# Patient Record
Sex: Male | Born: 1985
Health system: Southern US, Community
[De-identification: ages and names within clinical notes are randomized; demographics above are authoritative.]

## PROBLEM LIST (undated history)

## (undated) DIAGNOSIS — I499 Cardiac arrhythmia, unspecified: Secondary | ICD-10-CM

## (undated) DIAGNOSIS — F988 Other specified behavioral and emotional disorders with onset usually occurring in childhood and adolescence: Secondary | ICD-10-CM

## (undated) DIAGNOSIS — K219 Gastro-esophageal reflux disease without esophagitis: Secondary | ICD-10-CM

## (undated) DIAGNOSIS — Z8489 Family history of other specified conditions: Secondary | ICD-10-CM

## (undated) HISTORY — PX: WISDOM TOOTH EXTRACTION: SHX21

## (undated) HISTORY — PX: NO PAST SURGERIES: SHX2092

## (undated) HISTORY — DX: Other specified behavioral and emotional disorders with onset usually occurring in childhood and adolescence: F98.8

---

## 2014-01-23 ENCOUNTER — Encounter (HOSPITAL_COMMUNITY): Payer: Self-pay | Admitting: Emergency Medicine

## 2014-01-23 ENCOUNTER — Emergency Department (HOSPITAL_COMMUNITY): Payer: BC Managed Care – PPO

## 2014-01-23 ENCOUNTER — Emergency Department (HOSPITAL_COMMUNITY)
Admission: EM | Admit: 2014-01-23 | Discharge: 2014-01-23 | Disposition: A | Discharge status: Home / Self Care | Payer: BC Managed Care – PPO | Attending: Emergency Medicine | Admitting: Emergency Medicine

## 2014-01-23 DIAGNOSIS — R296 Repeated falls: Secondary | ICD-10-CM | POA: Diagnosis not present

## 2014-01-23 DIAGNOSIS — R112 Nausea with vomiting, unspecified: Secondary | ICD-10-CM

## 2014-01-23 DIAGNOSIS — S0990XA Unspecified injury of head, initial encounter: Secondary | ICD-10-CM

## 2014-01-23 DIAGNOSIS — Y92009 Unspecified place in unspecified non-institutional (private) residence as the place of occurrence of the external cause: Secondary | ICD-10-CM | POA: Insufficient documentation

## 2014-01-23 DIAGNOSIS — Y9389 Activity, other specified: Secondary | ICD-10-CM | POA: Insufficient documentation

## 2014-01-23 DIAGNOSIS — K920 Hematemesis: Secondary | ICD-10-CM | POA: Diagnosis not present

## 2014-01-23 DIAGNOSIS — F1010 Alcohol abuse, uncomplicated: Secondary | ICD-10-CM | POA: Insufficient documentation

## 2014-01-23 DIAGNOSIS — F10920 Alcohol use, unspecified with intoxication, uncomplicated: Secondary | ICD-10-CM

## 2014-01-23 MED ORDER — ONDANSETRON HCL 4 MG/2ML IJ SOLN
4.0000 mg | Freq: Once | INTRAMUSCULAR | Status: AC
  Administered 2014-01-23: 4 mg via INTRAVENOUS
  Filled 2014-01-23: qty 2

## 2014-01-23 MED ORDER — SODIUM CHLORIDE 0.9 % IV BOLUS (SEPSIS)
2000.0000 mL | Freq: Once | INTRAVENOUS | Status: AC
  Administered 2014-01-23: 2000 mL via INTRAVENOUS

## 2014-01-23 MED ORDER — PROMETHAZINE HCL 25 MG PO TABS: 25.0000 mg | ORAL_TABLET | Freq: Four times a day (QID) | ORAL | Status: DC | PRN

## 2014-01-23 MED ORDER — ONDANSETRON 4 MG PO TBDP
4.0000 mg | ORAL_TABLET | Freq: Once | ORAL | Status: AC
  Administered 2014-01-23: 4 mg via ORAL
  Filled 2014-01-23: qty 1

## 2014-01-23 NOTE — ED Notes (Signed)
Pt brought in by friend for head injury following a fall today, friend reports alcohol consumption by pt throughout the day including shots of liquor and beer. Friend reports that after the fall, pt began vomiting up bright red blood. Pt denies any LOC during fall. Pt trying to get out of bed, slurred speech, pt intoxicated and refusing to answer questions at this time. Pt actively dry heaving.

## 2014-01-23 NOTE — Discharge Instructions (Signed)
Alcohol Intoxication °Alcohol intoxication occurs when the amount of alcohol that a person has consumed impairs his or her ability to mentally and physically function. Alcohol directly impairs the normal chemical activity of the brain. Drinking large amounts of alcohol can lead to changes in mental function and behavior, and it can cause many physical effects that can be harmful.  °Alcohol intoxication can range in severity from mild to very severe. Various factors can affect the level of intoxication that occurs, such as the person's age, gender, weight, frequency of alcohol consumption, and the presence of other medical conditions (such as diabetes, seizures, or heart conditions). Dangerous levels of alcohol intoxication may occur when people drink large amounts of alcohol in a short period (binge drinking). Alcohol can also be especially dangerous when combined with certain prescription medicines or "recreational" drugs. °SIGNS AND SYMPTOMS °Some common signs and symptoms of mild alcohol intoxication include: °· Loss of coordination. °· Changes in mood and behavior. °· Impaired judgment. °· Slurred speech. °As alcohol intoxication progresses to more severe levels, other signs and symptoms will appear. These may include: °· Vomiting. °· Confusion and impaired memory. °· Slowed breathing. °· Seizures. °· Loss of consciousness. °DIAGNOSIS  °Your health care provider will take a medical history and perform a physical exam. You will be asked about the amount and type of alcohol you have consumed. Blood tests will be done to measure the concentration of alcohol in your blood. In many places, your blood alcohol level must be lower than 80 mg/dL (0.08%) to legally drive. However, many dangerous effects of alcohol can occur at much lower levels.  °TREATMENT  °People with alcohol intoxication often do not require treatment. Most of the effects of alcohol intoxication are temporary, and they go away as the alcohol naturally  leaves the body. Your health care provider will monitor your condition until you are stable enough to go home. Fluids are sometimes given through an IV access tube to help prevent dehydration.  °HOME CARE INSTRUCTIONS °· Do not drive after drinking alcohol. °· Stay hydrated. Drink enough water and fluids to keep your urine clear or pale yellow. Avoid caffeine.   °· Only take over-the-counter or prescription medicines as directed by your health care provider.   °SEEK MEDICAL CARE IF:  °· You have persistent vomiting.   °· You do not feel better after a few days. °· You have frequent alcohol intoxication. Your health care provider can help determine if you should see a substance use treatment counselor. °SEEK IMMEDIATE MEDICAL CARE IF:  °· You become shaky or tremble when you try to stop drinking.   °· You shake uncontrollably (seizure).   °· You throw up (vomit) blood. This may be bright red or may look like black coffee grounds.   °· You have blood in your stool. This may be bright red or may appear as a black, tarry, bad smelling stool.   °· You become lightheaded or faint.   °MAKE SURE YOU:  °· Understand these instructions. °· Will watch your condition. °· Will get help right away if you are not doing well or get worse. °Document Released: 04/03/2005 Document Revised: 02/24/2013 Document Reviewed: 11/27/2012 °Exit[employer]® Patient Information ©2015 [employer]. This information is not intended to replace advice given to you by your health care provider. Make sure you discuss any questions you have with your health care provider. °Head Injury °You have received a head injury. It does not appear serious at this time. Headaches and vomiting are common following head injury. It should be   easy to awaken from sleeping. Sometimes it is necessary for you to stay in the emergency department for a while for observation. Sometimes admission to the hospital may be needed. After injuries such as yours, most problems occur  within the first 24 hours, but side effects may occur up to 7-10 days after the injury. It is important for you to carefully monitor your condition and contact your health care provider or seek immediate medical care if there is a change in your condition. °WHAT ARE THE TYPES OF HEAD INJURIES? °Head injuries can be as minor as a bump. Some head injuries can be more severe. More severe head injuries include: °· A jarring injury to the brain (concussion). °· A bruise of the brain (contusion). This mean there is bleeding in the brain that can cause swelling. °· A cracked skull (skull fracture). °· Bleeding in the brain that collects, clots, and forms a bump (hematoma). °WHAT CAUSES A HEAD INJURY? °A serious head injury is most likely to happen to someone who is in a car wreck and is not wearing a seat belt. Other causes of major head injuries include bicycle or motorcycle accidents, sports injuries, and falls. °HOW ARE HEAD INJURIES DIAGNOSED? °A complete history of the event leading to the injury and your current symptoms will be helpful in diagnosing head injuries. Many times, pictures of the brain, such as CT or MRI are needed to see the extent of the injury. Often, an overnight hospital stay is necessary for observation.  °WHEN SHOULD I SEEK IMMEDIATE MEDICAL CARE?  °You should get help right away if: °· You have confusion or drowsiness. °· You feel sick to your stomach (nauseous) or have continued, forceful vomiting. °· You have dizziness or unsteadiness that is getting worse. °· You have severe, continued headaches not relieved by medicine. Only take over-the-counter or prescription medicines for pain, fever, or discomfort as directed by your health care provider. °· You do not have normal function of the arms or legs or are unable to walk. °· You notice changes in the black spots in the center of the colored part of your eye (pupil). °· You have a clear or bloody fluid coming from your nose or ears. °· You have  a loss of vision. °During the next 24 hours after the injury, you must stay with someone who can watch you for the warning signs. This person should contact local emergency services (911 in the [place] if you have seizures, you become unconscious, or you are unable to wake up. °HOW CAN I PREVENT A HEAD INJURY IN THE FUTURE? °The most important factor for preventing major head injuries is avoiding motor vehicle accidents.  To minimize the potential for damage to your head, it is crucial to wear seat belts while riding in motor vehicles. Wearing helmets while bike riding and playing collision sports (like football) is also helpful. Also, avoiding dangerous activities around the house will further help reduce your risk of head injury.  °WHEN CAN I RETURN TO NORMAL ACTIVITIES AND ATHLETICS? °You should be reevaluated by your health care provider before returning to these activities. If you have any of the following symptoms, you should not return to activities or contact sports until 1 week after the symptoms have stopped: °· Persistent headache. °· Dizziness or vertigo. °· Poor attention and concentration. °· Confusion. °· Memory problems. °· Nausea or vomiting. °· Fatigue or tire easily. °· Irritability. °· Intolerant of bright lights or loud noises. °· Anxiety or depression. °·   Disturbed sleep. °MAKE SURE YOU:  °· Understand these instructions. °· Will watch your condition. °· Will get help right away if you are not doing well or get worse. °Document Released: 06/24/2005 Document Revised: 06/29/2013 Document Reviewed: 03/01/2013 °Exit[employer]® Patient Information ©2015 [employer]. This information is not intended to replace advice given to you by your health care provider. Make sure you discuss any questions you have with your health care provider. ° °

## 2014-01-23 NOTE — ED Provider Notes (Signed)
TIME SEEN: 5:15 PM  CHIEF COMPLAINT: Fall, head injury, intoxication  HPI: Patient is a Edward Bentley from with no significant past medical history who presents emergency department intoxicated with vomiting. He lives with his fiance he states he has been drinking all day with his friends at a house warming party. She states that today he fell from a standing position, striking his head. No loss of consciousness. He has been vomiting since and has had some blood-streaked vomit. She denies that she thinks he's had any drug use. No other injury on exam. History is limited given patient is to intoxicated.  ROS: See HPI Constitutional: no fever  Eyes: no drainage  ENT: no runny nose   Cardiovascular:  no chest pain  Resp: no SOB  GI: no vomiting GU: no dysuria Integumentary: no rash  Allergy: no hives  Musculoskeletal: no leg swelling  Neurological: no slurred speech ROS otherwise negative  PAST MEDICAL HISTORY/PAST SURGICAL HISTORY:  History reviewed. No pertinent past medical history.  MEDICATIONS:  Prior to Admission medications   Not on File    ALLERGIES:  No Known Allergies  SOCIAL HISTORY:  History  Substance Use Topics  . Smoking status: Never Smoker   . Smokeless tobacco: Not on file  . Alcohol Use: Yes     Comment: occassional    FAMILY HISTORY: No family history on file.  EXAM: BP 107/57  Pulse 115  Temp(Src) 97.9 F (36.6 C) (Oral)  Resp 16  SpO2 94% CONSTITUTIONAL: Alert and oriented and responds appropriately to questions intermittently. Appears intoxicated, actively vomiting, smells of alcohol HEAD: Normocephalic, abrasion and small hematoma to his right forehead EYES: Conjunctivae clear, PERRL ENT: normal nose; no rhinorrhea; moist mucous membranes; pharynx without lesions noted NECK: Supple, no meningismus, no LAD, no midline spinal tenderness or step-off or deformity  CARD: RRR; S1 and S2 appreciated; no murmurs, no clicks, no rubs, no gallops RESP: Normal  chest excursion without splinting or tachypnea; breath sounds clear and equal bilaterally; no wheezes, no rhonchi, no rales,  ABD/GI: Normal bowel sounds; non-distended; soft, non-tender, no rebound, no guarding BACK:  The back appears normal and is non-tender to palpation, there is no CVA tenderness, no midline spinal tenderness or step-off or deformity EXT: Normal ROM in all joints; non-tender to palpation; no edema; normal capillary refill; no cyanosis    SKIN: Normal color for age and race; warm NEURO: Moves all extremities equally, patient has mild slurred speech but is intoxicated, otherwise cranial nerves II through XII intact, normal sensation diffusely PSYCH: The patient's mood and manner are appropriate. Grooming and personal hygiene are appropriate.  MEDICAL DECISION MAKING: Patient here with alcohol intoxication, vomiting and a head injury. Vomiting may be secondary to intoxication but given history of head injury and active vomiting, will obtain a CT of his head and cervical spine. We'll give IV fluids and Zofran and reassess.  ED PROGRESS: CT head and cervical spine show no acute injury. His vomiting has stopped. We'll reassess him clinically sober.   9:12 PM  Pt states he is feeling much better. He is tearful, states he is embarrassed. He is now able to tolerate by mouth, walking around the room. His fiance is at the bedside and can drive him home. He is neurologically intact and I feel he is safe for discharge.  Edward MawKristen N Sheala Dosh, DO 01/23/14 2112

## 2014-01-23 NOTE — ED Notes (Signed)
Pt alert, able to stand at bedside to void.

## 2014-09-02 ENCOUNTER — Encounter: Payer: Self-pay | Admitting: Internal Medicine

## 2014-09-02 ENCOUNTER — Ambulatory Visit (INDEPENDENT_AMBULATORY_CARE_PROVIDER_SITE_OTHER): Payer: 59 | Admitting: Internal Medicine

## 2014-09-02 ENCOUNTER — Encounter: Payer: Self-pay | Admitting: Radiology

## 2014-09-02 VITALS — BP 112/84 | HR 68 | Temp 98.4°F | Ht 69.66 in | Wt 219.0 lb

## 2014-09-02 DIAGNOSIS — K4090 Unilateral inguinal hernia, without obstruction or gangrene, not specified as recurrent: Secondary | ICD-10-CM

## 2014-09-02 DIAGNOSIS — F909 Attention-deficit hyperactivity disorder, unspecified type: Secondary | ICD-10-CM

## 2014-09-02 DIAGNOSIS — L819 Disorder of pigmentation, unspecified: Secondary | ICD-10-CM

## 2014-09-02 DIAGNOSIS — Z0000 Encounter for general adult medical examination without abnormal findings: Secondary | ICD-10-CM

## 2014-09-02 DIAGNOSIS — F988 Other specified behavioral and emotional disorders with onset usually occurring in childhood and adolescence: Secondary | ICD-10-CM

## 2014-09-02 LAB — CBC WITH DIFFERENTIAL/PLATELET
Basophils Absolute: 0 10*3/uL (ref 0.0–0.1)
Basophils Relative: 0 % (ref 0–1)
Eosinophils Absolute: 0.1 10*3/uL (ref 0.0–0.7)
Eosinophils Relative: 1 % (ref 0–5)
HEMATOCRIT: 42.9 % (ref 39.0–52.0)
HEMOGLOBIN: 14.8 g/dL (ref 13.0–17.0)
LYMPHS ABS: 2.6 10*3/uL (ref 0.7–4.0)
Lymphocytes Relative: 32 % (ref 12–46)
MCH: 29.5 pg (ref 26.0–34.0)
MCHC: 34.5 g/dL (ref 30.0–36.0)
MCV: 85.6 fL (ref 78.0–100.0)
MONOS PCT: 8 % (ref 3–12)
MPV: 9.9 fL (ref 8.6–12.4)
Monocytes Absolute: 0.6 10*3/uL (ref 0.1–1.0)
NEUTROS ABS: 4.8 10*3/uL (ref 1.7–7.7)
NEUTROS PCT: 59 % (ref 43–77)
Platelets: 262 10*3/uL (ref 150–400)
RBC: 5.01 MIL/uL (ref 4.22–5.81)
RDW: 12.9 % (ref 11.5–15.5)
WBC: 8.1 10*3/uL (ref 4.0–10.5)

## 2014-09-02 MED ORDER — AMPHETAMINE-DEXTROAMPHET ER 10 MG PO CP24: 10.0000 mg | ORAL_CAPSULE | Freq: Every day | ORAL | Status: DC

## 2014-09-02 NOTE — Progress Notes (Signed)
HPI  Pt presents to the clinic today to establish care. He is transferring care from [facility].  Flu: never Tetanus:2008 Dentist: as needed  ADD: diagnosed at age 29, psych [facility]. Has been on Adderall, Concerta, Straterra. He had trouble affording his medication. He tolerated Adderall the best. He has trouble focusing, staying organized, feels distracted, doesn't stay on task, etc. 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  He also reports pain in his right groin. This has been there for the past few months. It hurts when he coughs or sneezes. He has not had any injury to the area. He has not tried anything OTC.  History reviewed. No pertinent past medical history.  No current outpatient prescriptions on file.   No current facility-administered medications for this visit.    No Known Allergies  Family History  Problem Relation Age of Onset  . Hypertension Mother   . Hypertension Father   . Heart disease Maternal Grandfather   . Alcohol abuse Paternal Grandfather     History   Social History  . Marital Status: Single    Spouse Name: N/A  . Number of Children: N/A  . Years of Education: N/A   Occupational History  . Not on file.   Social History Main Topics  . Smoking status: Never Smoker   . Smokeless tobacco: Never Used  . Alcohol Use: 0.0 oz/week    0 Standard drinks or equivalent per week     Comment: occasional--social  . Drug Use: Not on file  . Sexual Activity: Not on file   Other Topics Concern  . Not on file   Social History Narrative    ROS:  Constitutional: Denies fever, malaise, fatigue, headache or abrupt weight changes.  HEENT: Denies eye pain, eye redness, ear pain, ringing in the ears, wax buildup, runny nose, nasal congestion, bloody nose, or sore throat. Respiratory: Denies difficulty breathing, shortness of breath, cough or sputum production.   Cardiovascular: Denies chest pain, chest tightness, palpitations or  swelling in the hands or feet.  Gastrointestinal: Denies abdominal pain, bloating, constipation, diarrhea or blood in the stool.  GU: Denies frequency, urgency, pain with urination, blood in urine, odor or discharge. Musculoskeletal: Pt reports right groin pain. Denies decrease in range of motion, difficulty with gait, muscle pain or joint pain and swelling.  Skin: Pt reports abnormal mole on back. Denies redness, rashes, lesions or ulcercations.  Neurological: Pt reports difficulty with memory, concentration, attention and focusing. Denies dizziness, difficulty with speech or problems with balance and coordination.   No other specific complaints in a complete review of systems (except as listed in HPI above).  PE:  BP 112/84 mmHg  Pulse 68  Temp(Src) 98.4 F (36.9 C) (Oral)  Ht 5' 9.66" (1.769 m)  Wt 219 lb (99.338 kg)  BMI 31.74 kg/m2  SpO2 98% Wt Readings from Last 3 Encounters:  09/02/14 219 lb (99.338 kg)    General: Appears his stated age, well developed, well nourished in NAD. Skin: Warm, dry and intact. Small, circular < 1 cm mole noted on inferior back. Flesh colored with darker center. HEENT: Head: normal shape and size; Eyes: sclera white, no icterus, conjunctiva pink, PERRLA and EOMs intact; Ears: Tm's gray and intact, normal light reflex; Throat/Mouth: Teeth present, mucosa pink and moist, no lesions or ulcerations noted.  Neck: Neck supple, trachea midline. No massses, lumps or thyromegaly present.  Cardiovascular: Normal rate and rhythm. S1,S2 noted.  No murmur, rubs or  gallops noted. No JVD or BLE edema. No carotid bruits noted. Pulmonary/Chest: Normal effort and positive vesicular breath sounds. No respiratory distress. No wheezes, rales or ronchi noted.  Abdomen: Soft and nontender. Normal bowel sounds, no bruits noted. No distention or masses noted. Liver, spleen and kidneys non palpable. Right, direct inguinal hernia noted. Musculoskeletal: Normal range of motion.  Strength 5/5 BUE/BLE. No difficulty with gait.  Neurological: Alert and oriented. Cranial nerves II-XII grossly intact.  Psychiatric: Mood and affect normal. Behavior is normal. Judgment normal. Thought jumping from topic to topic.    Assessment and Plan:  Preventative Health Maintenance:  Encouraged him to work on diet and exercise Will check CBC, CMET and Lipid profile today Encouraged him to vist a dentist at least yearly  Right inguinal hernia:  Referral placed to general surgery  Atypical Mole on back:  He will self-refer to dermatology  RTC in 1 year or sooner if needed

## 2014-09-02 NOTE — Assessment & Plan Note (Signed)
Will start Adderall 10 mg ER CSA and UDS done today

## 2014-09-02 NOTE — Patient Instructions (Signed)
Granuloma Inguinale Granuloma inguinale is a sexually transmitted infection (STI) of the genital area. Most often, the infection is spread through vaginal or anal intercourse. This infection causes painless bumps and ulcers in the genital and anal areas. If not treated early, the infection can cause damage to the genital tissue. Secondary infections may also develop. People with granuloma inguinale are also at risk for other sexually transmitted diseases (STDs). Granuloma inguinale is more common in men than in women.  CAUSES  Granuloma inguinale is caused by the bacteria called Klebsiella granulomatis. It is spread through sexual contact with the infected person.  SYMPTOMS  Symptoms appear 1 to 12 weeks after the person comes in contact with the bacteria. Symptoms involve the genital and anal area and include:  Red bumps that are often painless.  Breaks in the skin (ulcerations).  The destruction of genital tissue that may spread to the legs.  Loss of skin color of the genitals and surrounding area. DIAGNOSIS  Your caregiver will examine the sores in the genital or anal area. You will be asked how long the sores have been there. If the sores have been there for a long time or if they are spreading, this is an indication that you may have this infection or another STD. Your caregiver may take scrapings or a tissue sample (biopsy) from the affected area. If any STD is detected, your caregiver may choose to test for other STDs. This may include chlamydia, gonorrhea, syphilis, human papillomavirus (HPV), and HIV or AIDS. Ask when your test results will be ready. Make sure you get your test results. TREATMENT  Treatment of this disease lowers the risk of ongoing damage and worsening scarring to the genitals and skin tissue. Antibiotic medicines are prescribed to treat this infection.You may need totake antibiotics for 3 weeks or longer.Treatment should continue for the length of time prescribed.  If  an STD is discovered, treatment may be started to cover other possible infections.Follow-up examination is necessary because recurrence of this infection is common. HOME CARE INSTRUCTIONS   Take your antibiotic medicine as directed. Finish it even if you start to feel better.  Only take over-the-counter or prescription medicines as directed by your caregiver.  Schedule appointments for follow-up exams.  Do not engage in sexual activity until treatment is completed. Your sexual partner(s) should also be examined and treated.  Practice safe sex and use condoms to help prevent the spread of STDs. SEEK IMMEDIATE MEDICAL CARE IF:  You have a fever or persistent symptoms for more than 2-3 days.  You have a fever and your symptoms suddenly get worse.  Your infection is not getting better within 7 days of treatment.  Your infection recurs. MAKE SURE YOU:   Understand these instructions.  Will watch your condition.  Will get help right away if you are not doing well or get worse. Document Released: 06/21/2000 Document Revised: 03/18/2012 Document Reviewed: 01/21/2012 [facility] Patient Information 2015 [place], [place]. This information is not intended to replace advice given to you by your health care provider. Make sure you discuss any questions you have with your health care provider. Inguinal Hernia, Adult Muscles help keep everything in the body in its proper place. But if a weak spot in the muscles develops, something can poke through. That is called a hernia. When this happens in the lower part of the belly (abdomen), it is called an inguinal hernia. (It takes its name from a part of the body in this region called the  inguinal canal.) A weak spot in the wall of muscles lets some fat or part of the small intestine bulge through. An inguinal hernia can develop at any age. Men get them more often than women. CAUSES  In adults, an inguinal hernia develops over time.  It can be triggered  by:  Suddenly straining the muscles of the lower abdomen.  Lifting heavy objects.  Straining to have a bowel movement. Difficult bowel movements (constipation) can lead to this.  Constant coughing. This may be caused by smoking or lung disease.  Being overweight.  Being pregnant.  Working at a job that requires long periods of standing or heavy lifting.  Having had an inguinal hernia before. One type can be an emergency situation. It is called a strangulated inguinal hernia. It develops if part of the small intestine slips through the weak spot and cannot get back into the abdomen. The blood supply can be cut off. If that happens, part of the intestine may die. This situation requires emergency surgery. SYMPTOMS  Often, a small inguinal hernia has no symptoms. It is found when a healthcare provider does a physical exam. Larger hernias usually have symptoms.   In adults, symptoms may include:  A lump in the groin. This is easier to see when the person is standing. It might disappear when lying down.  In men, a lump in the scrotum.  Pain or burning in the groin. This occurs especially when lifting, straining or coughing.  A dull ache or feeling of pressure in the groin.  Signs of a strangulated hernia can include:  A bulge in the groin that becomes very painful and tender to the touch.  A bulge that turns red or purple.  Fever, nausea and vomiting.  Inability to have a bowel movement or to pass gas. DIAGNOSIS  To decide if you have an inguinal hernia, a healthcare provider will probably do a physical examination.  This will include asking questions about any symptoms you have noticed.  The healthcare provider might feel the groin area and ask you to cough. If an inguinal hernia is felt, the healthcare provider may try to slide it back into the abdomen.  Usually no other tests are needed. TREATMENT  Treatments can vary. The size of the hernia makes a difference. Options  include:  Watchful waiting. This is often suggested if the hernia is small and you have had no symptoms.  No medical procedure will be done unless symptoms develop.  You will need to watch closely for symptoms. If any occur, contact your healthcare provider right away.  Surgery. This is used if the hernia is larger or you have symptoms.  Open surgery. This is usually an outpatient procedure (you will not stay overnight in a hospital). An cut (incision) is made through the skin in the groin. The hernia is put back inside the abdomen. The weak area in the muscles is then repaired by herniorrhaphy or hernioplasty. Herniorrhaphy: in this type of surgery, the weak muscles are sewn back together. Hernioplasty: a patch or mesh is used to close the weak area in the abdominal wall.  Laparoscopy. In this procedure, a surgeon makes small incisions. A thin tube with a tiny video camera (called a laparoscope) is put into the abdomen. The surgeon repairs the hernia with mesh by looking with the video camera and using two long instruments. HOME CARE INSTRUCTIONS   After surgery to repair an inguinal hernia:  You will need to take pain medicine prescribed  by your healthcare provider. Follow all directions carefully.  You will need to take care of the wound from the incision.  Your activity will be restricted for awhile. This will probably include no heavy lifting for several weeks. You also should not do anything too active for a few weeks. When you can return to work will depend on the type of job that you have.  During "watchful waiting" periods, you should:  Maintain a healthy weight.  Eat a diet high in fiber (fruits, vegetables and whole grains).  Drink plenty of fluids to avoid constipation. This means drinking enough water and other liquids to keep your urine clear or pale yellow.  Do not lift heavy objects.  Do not stand for long periods of time.  Quit smoking. This should keep you from  developing a frequent cough. SEEK MEDICAL CARE IF:   A bulge develops in your groin area.  You feel pain, a burning sensation or pressure in the groin. This might be worse if you are lifting or straining.  You develop a fever of more than 100.5 F (38.1 C). SEEK IMMEDIATE MEDICAL CARE IF:   Pain in the groin increases suddenly.  A bulge in the groin gets bigger suddenly and does not go down.  For men, there is sudden pain in the scrotum. Or, the size of the scrotum increases.  A bulge in the groin area becomes red or purple and is painful to touch.  You have nausea or vomiting that does not go away.  You feel your heart beating much faster than normal.  You cannot have a bowel movement or pass gas.  You develop a fever of more than 102.0 F (38.9 C). Document Released: 11/10/2008 Document Revised: 09/16/2011 Document Reviewed: 11/10/2008 [facility] Patient Information 2015 [place], [place]. This information is not intended to replace advice given to you by your health care provider. Make sure you discuss any questions you have with your health care provider.

## 2014-09-02 NOTE — Progress Notes (Signed)
Pre visit review using our clinic review tool, if applicable. No additional management support is needed unless otherwise documented below in the visit note. 

## 2014-09-03 LAB — LIPID PANEL
CHOL/HDL RATIO: 6.8 ratio
Cholesterol: 184 mg/dL (ref 0–200)
HDL: 27 mg/dL — AB (ref >=40)
LDL Cholesterol: 82 mg/dL (ref 0–99)
TRIGLYCERIDES: 377 mg/dL — AB (ref <150)
VLDL: 75 mg/dL — AB (ref 0–40)

## 2014-09-03 LAB — COMPREHENSIVE METABOLIC PANEL
ALT: 33 U/L (ref 0–53)
AST: 21 U/L (ref 0–37)
Albumin: 4.6 g/dL (ref 3.5–5.2)
Alkaline Phosphatase: 67 U/L (ref 39–117)
BILIRUBIN TOTAL: 1.2 mg/dL (ref 0.2–1.2)
BUN: 14 mg/dL (ref 6–23)
CO2: 27 mEq/L (ref 19–32)
Calcium: 9.6 mg/dL (ref 8.4–10.5)
Chloride: 103 mEq/L (ref 96–112)
Creat: 0.96 mg/dL (ref 0.50–1.35)
Glucose, Bld: 86 mg/dL (ref 70–99)
Potassium: 4.2 mEq/L (ref 3.5–5.3)
SODIUM: 138 meq/L (ref 135–145)
TOTAL PROTEIN: 7.5 g/dL (ref 6.0–8.3)

## 2014-09-19 ENCOUNTER — Ambulatory Visit (INDEPENDENT_AMBULATORY_CARE_PROVIDER_SITE_OTHER): Payer: Self-pay | Admitting: Surgery

## 2014-09-19 NOTE — H&P (Signed)
Edward Bentley. Crumpacker 09/19/2014 11:06 AM Location: [facility] Patient #: [id number] DOB: July 20, 1985 Married / Language: [place] / Race: White Male History of Present Illness Edward Bentley; 09/19/2014 11:34 AM) Patient words: RIH  pt sent at the request of [name] NP for right inguinal hernia present for 4 - 5 years.  The patient is a 29 year old male who presents with an inguinal hernia. The hernia(s) is/are located on the right side. Symptoms include inguinal bulge and inguinal pain. The pain is located in the right inguinal area. There is no radiation. The patient describes the pain as dull. There is no known event that preceded symptom onset. The patient describes this as mild and worsening. Symptoms are exacerbated by lifting. Other Problems [name], CMA; 09/19/2014 11:06 AM) No pertinent past medical history  Past Surgical History [name], CMA; 09/19/2014 11:06 AM) Oral Surgery  Diagnostic Studies History [name], CMA; 09/19/2014 11:06 AM) Colonoscopy never  Allergies ([name], CMA; 09/19/2014 11:07 AM) No Known Drug Allergies 09/19/2014  Medication History ([name], CMA; 09/19/2014 11:08 AM) Amphetamine-Dextroamphet ER (  Capsule ER 24HR, Oral) Active. Medications Reconciled  Social History [name], CMA; 09/19/2014 11:06 AM) Alcohol use Moderate alcohol use. Caffeine use Carbonated beverages, Coffee, Tea. No drug use Tobacco use Never smoker.  Family History [name], CMA; 09/19/2014 11:06 AM) Heart Disease Family Members In General. Heart disease in male family member before age 41 Hypertension Family Members In General, Father. Migraine Headache Mother.     Review of Systems [name] CMA; 09/19/2014 11:06 AM) General Not Present- Appetite Loss, Chills, Fatigue, Fever, Night Sweats, Weight Gain and Weight Loss. Skin Not Present- Change in Wart/Mole, Dryness, Hives, Jaundice, New Lesions, Non-Healing  Wounds, Rash and Ulcer. HEENT Present- Wears glasses/contact lenses. Not Present- Earache, Hearing Loss, Hoarseness, Nose Bleed, Oral Ulcers, Ringing in the Ears, Seasonal Allergies, Sinus Pain, Sore Throat, Visual Disturbances and Yellow Eyes. Respiratory Present- Snoring. Not Present- Bloody sputum, Chronic Cough, Difficulty Breathing and Wheezing. Breast Not Present- Breast Mass, Breast Pain, Nipple Discharge and Skin Changes. Cardiovascular Not Present- Chest Pain, Difficulty Breathing Lying Down, Leg Cramps, Palpitations, Rapid Heart Rate, Shortness of Breath and Swelling of Extremities. Gastrointestinal Not Present- Abdominal Pain, Bloating, Bloody Stool, Change in Bowel Habits, Chronic diarrhea, Constipation, Difficulty Swallowing, Excessive gas, Gets full quickly at meals, Hemorrhoids, Indigestion, Nausea, Rectal Pain and Vomiting. Male Genitourinary Not Present- Blood in Urine, Change in Urinary Stream, Frequency, Impotence, Nocturia, Painful Urination, Urgency and Urine Leakage. Musculoskeletal Not Present- Back Pain, Joint Pain, Joint Stiffness, Muscle Pain, Muscle Weakness and Swelling of Extremities. Neurological Not Present- Decreased Memory, Fainting, Headaches, Numbness, Seizures, Tingling, Tremor, Trouble walking and Weakness. Psychiatric Not Present- Anxiety, Bipolar, Change in Sleep Pattern, Depression, Fearful and Frequent crying. Endocrine Not Present- Cold Intolerance, Excessive Hunger, Hair Changes, Heat Intolerance, Hot flashes and New Diabetes. Hematology Not Present- Easy Bruising, Excessive bleeding, Gland problems, HIV and Persistent Infections.  Vitals ([name] CMA; 09/19/2014 11:07 AM) 09/19/2014 11:07 AM Weight: 216 lb Height: 70in Body Surface Area: 2.2 m Body Mass Index: 30.99 kg/m Temp.: 97.67F(Temporal)  Pulse: 77 (Regular)  BP: 130/70 (Sitting, Left Arm, Standard)     Physical Exam ([name] [name] Bentley; 09/19/2014 11:35  AM)  General Mental Status-Alert. General Appearance-Consistent with stated age. Hydration-Well hydrated. Voice-Normal.  Head and Neck Head-normocephalic, atraumatic with no lesions or palpable masses. Trachea-midline.  Chest and Lung Exam Chest and lung exam reveals -quiet, even and easy respiratory effort  with no use of accessory muscles and on auscultation, normal breath sounds, no adventitious sounds and normal vocal resonance. Inspection Chest Wall - Normal. Back - normal.  Cardiovascular Cardiovascular examination reveals -normal heart sounds, regular rate and rhythm with no murmurs and normal pedal pulses bilaterally.  Abdomen Inspection Hernias - Inguinal hernia - Right - Reducible.  Neurologic Neurologic evaluation reveals -alert and oriented x 3 with no impairment of recent or remote memory. Mental Status-Normal.  Musculoskeletal Normal Exam - Left-Upper Extremity Strength Normal and Lower Extremity Strength Normal. Normal Exam - Right-Upper Extremity Strength Normal, Lower Extremity Weakness.    Assessment & Plan ([name] [name] Bentley; 09/19/2014 11:32 AM)  RIGHT INGUINAL HERNIA (550.90  K40.90) Impression: discussed open and laparoscopic repair with mesh. pt would like to proceed with open right inguinal hernia rpair. The risk of hernia repair include bleeding, infection, organ injury, bowel injury, bladder injury, nerve injury recurrent hernia, blood clots, worsening of underlying condition, chronic pain, mesh use, open surgery, death, and the need for other operattions. Pt agrees to proceed  Current Plans Pt Education - [facility] Umbilical/ Inguinal Hernia HCI

## 2014-09-21 ENCOUNTER — Encounter: Payer: Self-pay | Admitting: Internal Medicine

## 2014-10-03 ENCOUNTER — Telehealth: Payer: Self-pay

## 2014-10-03 NOTE — Telephone Encounter (Signed)
Pt left v/m pt was seen 09/02/14 and was to cb if needing to increase Adderall. Pt request cb. Left v/m requesting pt to cb.

## 2014-10-05 NOTE — Telephone Encounter (Signed)
Pt request increase in Adderall XR 10 mg dosage;when pt takes Adderall XR 10 mg only suppresses appetite; no increase in focusing. When pt takes Adderall XR 10 mg taking 2 tabs pt can tell he has taken medication;pt is more focused. Pt request new rx for Adderall XR 20 mg taking one daily. Pt seen 09/02/14. Pt is out of med and request cb ASAP.

## 2014-10-05 NOTE — Telephone Encounter (Signed)
Okto increase to 20 mg once daily. OK to refill if Dr. Dayton MartesAron will sign for in my absence

## 2014-10-05 NOTE — Telephone Encounter (Signed)
Pt left v/m returning call. Left v/m requesting cb from pt.

## 2014-10-05 NOTE — Telephone Encounter (Signed)
Since this is a change of dosage in a controlled substances, I will need to defer this to PCP.

## 2014-10-06 ENCOUNTER — Telehealth: Payer: Self-pay | Admitting: Internal Medicine

## 2014-10-06 MED ORDER — AMPHETAMINE-DEXTROAMPHET ER 20 MG PO CP24: 20.0000 mg | ORAL_CAPSULE | ORAL | Status: DC

## 2014-10-06 NOTE — Telephone Encounter (Signed)
LVM for pt to call office. Pt does NOT need to have lab ([facility]) [facility] He did that [facility] Feb. A mistake was made on the envelope. He does need to come pick up the RX. [facility]

## 2014-10-06 NOTE — Telephone Encounter (Signed)
Ok to print and put in my box for signature. 

## 2014-10-06 NOTE — Telephone Encounter (Signed)
Lm on pts vm and informed him Rx is available for pickup

## 2014-11-03 ENCOUNTER — Other Ambulatory Visit: Payer: Self-pay | Admitting: *Deleted

## 2014-11-03 ENCOUNTER — Other Ambulatory Visit: Payer: Self-pay | Admitting: Internal Medicine

## 2014-11-03 MED ORDER — AMPHETAMINE-DEXTROAMPHET ER 30 MG PO CP24: 30.0000 mg | ORAL_CAPSULE | ORAL | Status: DC

## 2014-11-03 NOTE — Telephone Encounter (Signed)
RX for 30 mg printed and signed, placed in MYD box

## 2014-11-03 NOTE — Telephone Encounter (Signed)
OV 09/02/14.  Last filled #30 10/06/14.  Medication was recently increased from 10 to 20mg .  Patient is requesting another increase as he feels the 20mg  is still not helpful.  He says he used to take 30mg  in the past and this worked well.

## 2014-11-04 NOTE — Telephone Encounter (Signed)
Rx left in front office for pick up and pt is aware  

## 2014-12-02 ENCOUNTER — Other Ambulatory Visit: Payer: Self-pay

## 2014-12-02 MED ORDER — AMPHETAMINE-DEXTROAMPHET ER 30 MG PO CP24: 30.0000 mg | ORAL_CAPSULE | ORAL | Status: DC

## 2014-12-02 NOTE — Telephone Encounter (Signed)
Pt is aware that Rx is ready and appt made for Sat clinic

## 2014-12-02 NOTE — Telephone Encounter (Signed)
RX for Adderall printed and signed and placed in MYD box. He will need to be seen for rash as he has not mentioned this in the past.

## 2014-12-02 NOTE — Telephone Encounter (Signed)
Pt left v/m requesting rx for Adderall. Call when ready for pick up. rx last printed 11/03/14 and pt seen 09/02/14. Pt also request rx for ketoconazole for summertime rash; do not see on current or hx med list. Does pt need to be seen about rash?

## 2014-12-03 ENCOUNTER — Ambulatory Visit (INDEPENDENT_AMBULATORY_CARE_PROVIDER_SITE_OTHER): Payer: 59 | Admitting: Family Medicine

## 2014-12-03 ENCOUNTER — Encounter: Payer: Self-pay | Admitting: Family Medicine

## 2014-12-03 VITALS — BP 100/70 | Temp 97.8°F | Wt 213.0 lb

## 2014-12-03 DIAGNOSIS — B360 Pityriasis versicolor: Secondary | ICD-10-CM | POA: Diagnosis not present

## 2014-12-03 MED ORDER — FLUCONAZOLE 150 MG PO TABS: 300.0000 mg | ORAL_TABLET | Freq: Every day | ORAL | Status: DC

## 2014-12-03 NOTE — Assessment & Plan Note (Signed)
Failed topical tx prev.  Use diflucan PO, routine cautions d/w pt.  He agrees.  F/u prn.

## 2014-12-03 NOTE — Patient Instructions (Signed)
Tinea versicolor.  Use the diflucan for 2 weeks.   Take care.  Glad to see you.

## 2014-12-03 NOTE — Progress Notes (Signed)
Rash noted.  Intermittently since 2007, has it every summer.  Had seen derm prev.  Was told it was a fungus prev.  Photosensitive rash.   Meds, vitals, and allergies reviewed.   ROS: See HPI.  Otherwise, noncontributory.  nad Blanching macular rash on the trunk, looks typical for tinea versicolor.

## 2014-12-28 IMAGING — CT CT HEAD W/O CM
3 of 8 series · 9 of 47 positions shown, 11 images · non-contrast
Comparison: None.

CLINICAL DATA: Intoxicated patient who fell and sustained a head
injury.

EXAM:
CT HEAD WITHOUT CONTRAST
CT CERVICAL SPINE WITHOUT CONTRAST
TECHNIQUE: Multidetector CT imaging of the head and cervical spine was
performed following the standard protocol without intravenous
contrast. Multiplanar CT image reconstructions of the cervical spine
were also generated.

[Series 10: sagittals · sagittal · 0.31mm/px · 1 of 36 slices shown]
[im 18/36  brain]
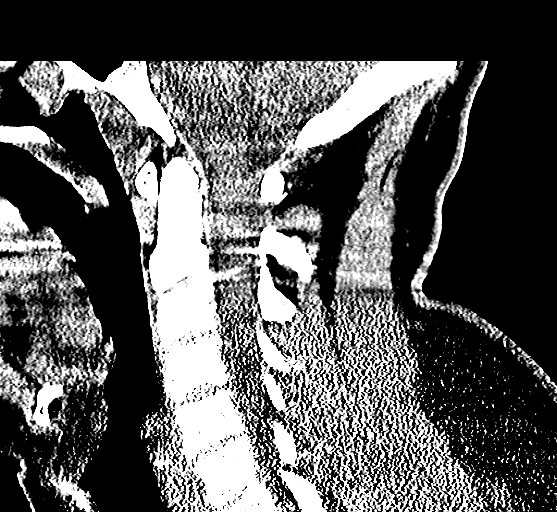

[Series 11: orthogonals · axial · 0.16mm/px · z∈[+218,+334]mm · 5 of 75 slices shown, 7 images]
[im 8/75  brain]
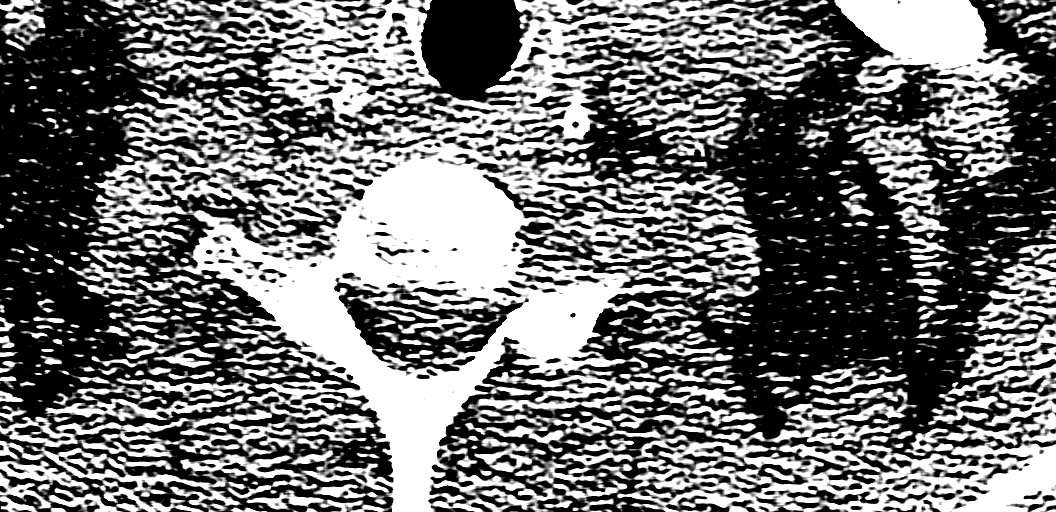
[im 8/75  bone]
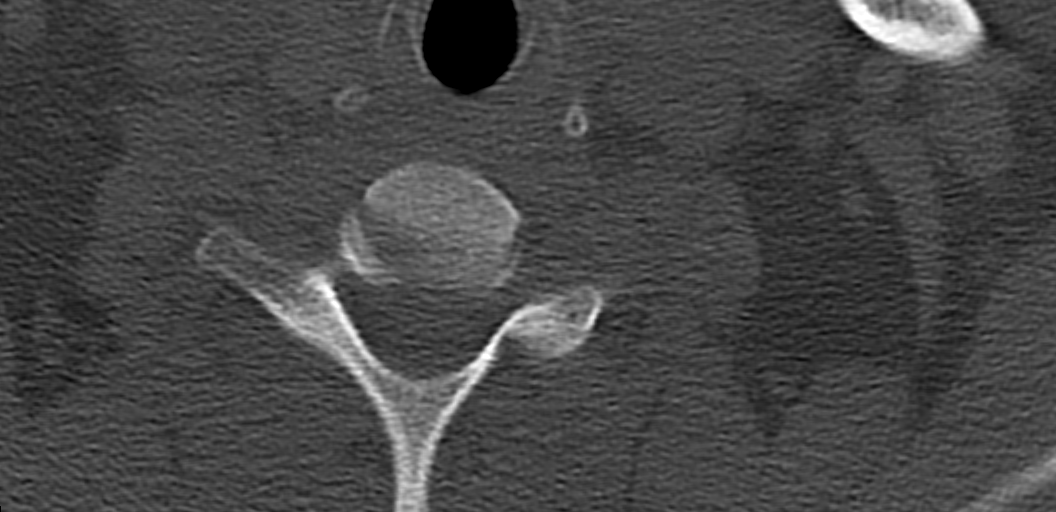
[im 23/75  brain]
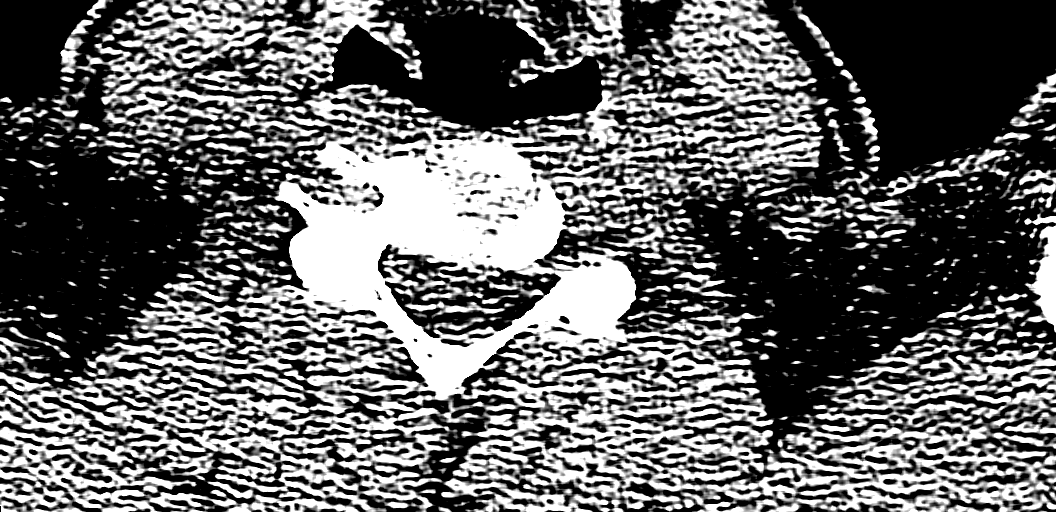
[im 38/75  brain]
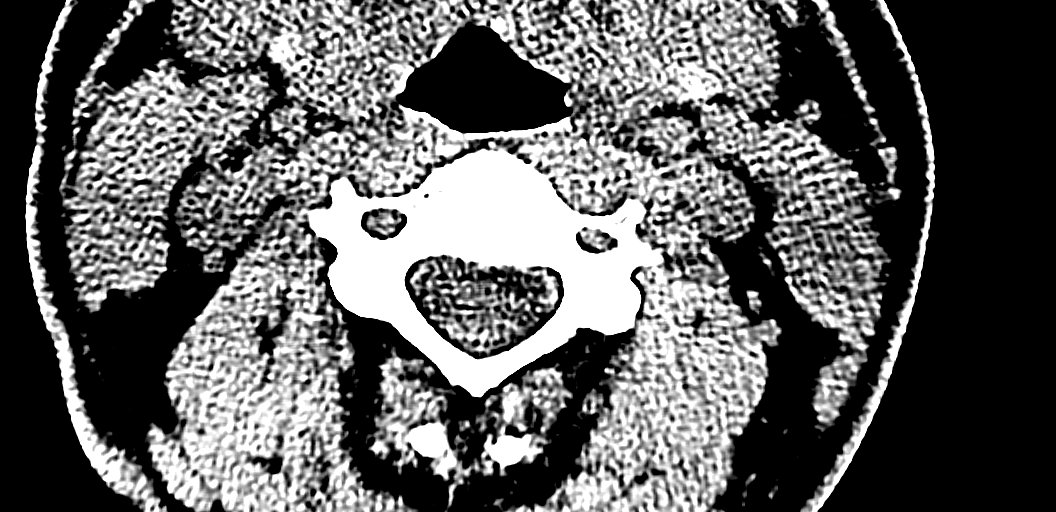
[im 52/75  brain]
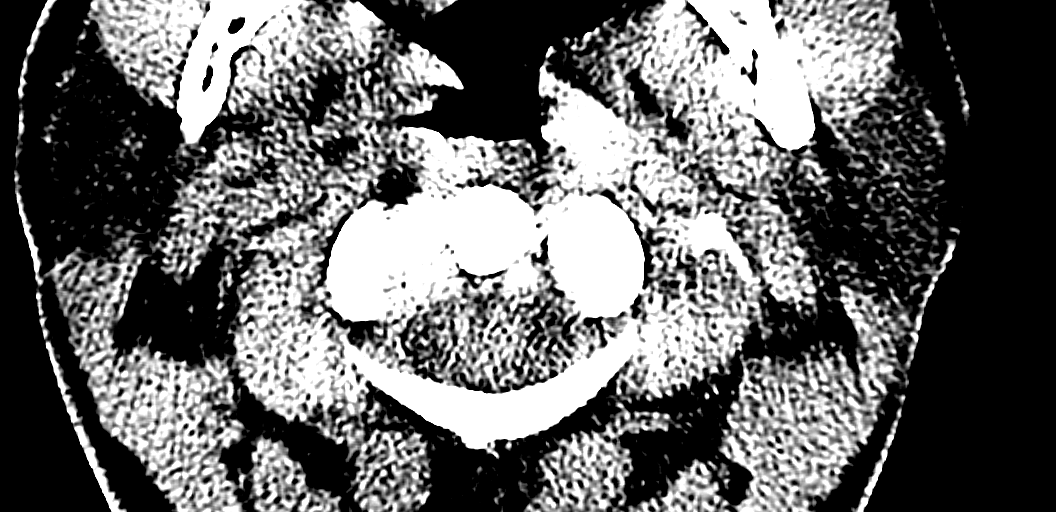
[im 67/75  brain]
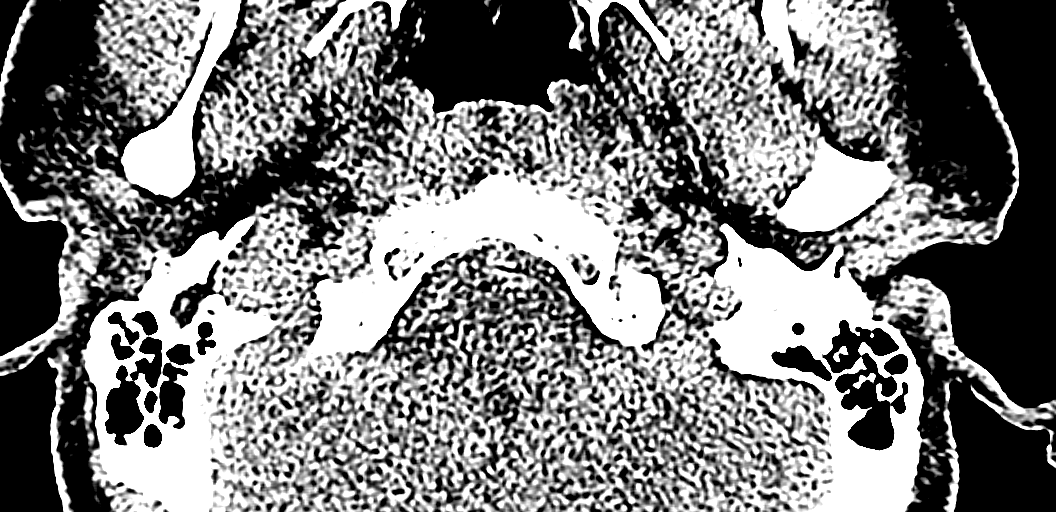
[im 67/75  bone]
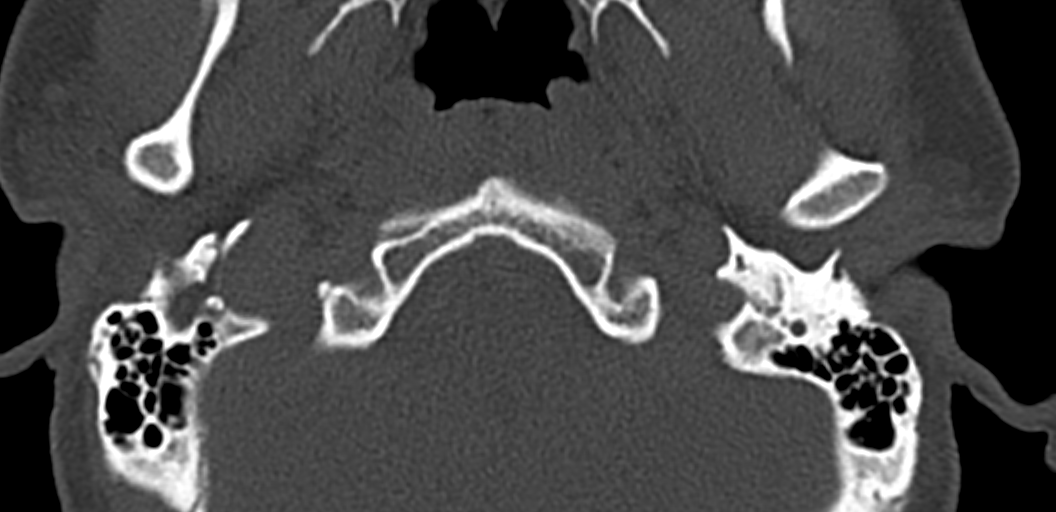

[Series 604: coronals · coronal · 0.26mm/px · 3 of 29 slices shown]
[im 17/29  brain]
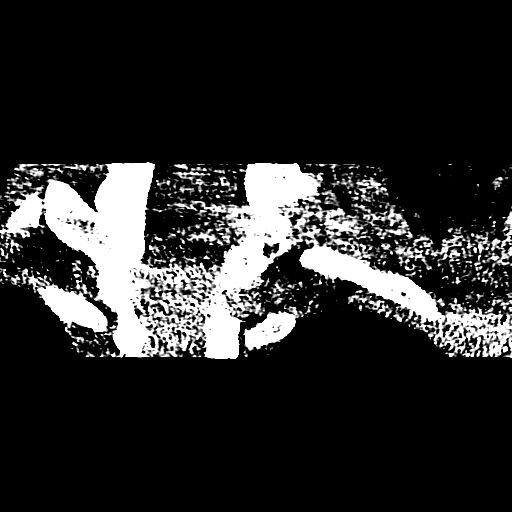
[im 20/29  brain]
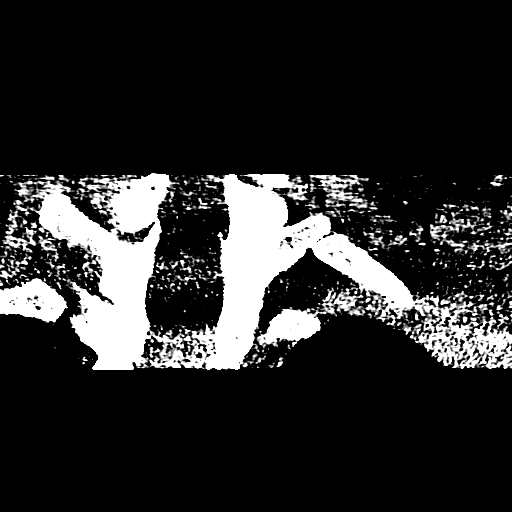
[im 23/29  brain]
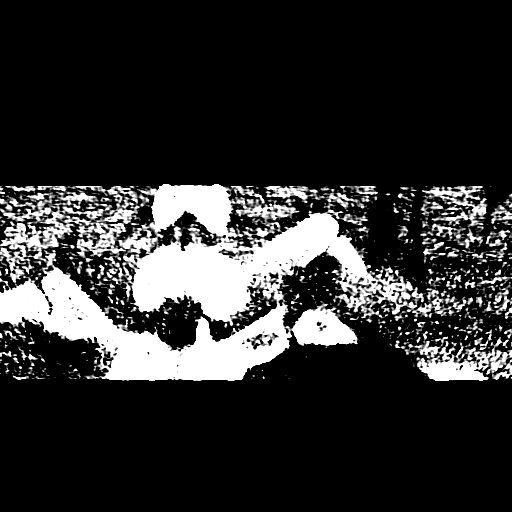

[9 of 47 positions shown; findings below may reference images not displayed]

FINDINGS: CT HEAD FINDINGS

Ventricular system normal in size and appearance for age. No mass
lesion. No midline shift. No acute hemorrhage or hematoma. No
extra-axial fluid collections. No evidence of acute infarction. No
focal brain parenchymal abnormalities.

No skull fractures or other focal osseous abnormalities involving
the skull. Visualized paranasal sinuses, bilateral mastoid air
cells, and bilateral middle ear cavities well-aerated.

CT CERVICAL SPINE FINDINGS

Patient motion blurred images at C1-C2, but the study appears
diagnostic. Neck tilted slightly to the right. No fractures
identified involving the cervical spine. Sagittal reconstructed
images demonstrate anatomic posterior alignment. No evidence of
spinal stenosis. Neural foramina widely patent throughout. Facet
joints intact throughout. Disc spaces well preserved, and no
significant disc protrusion identified on the soft tissue windows.
Coronal reformatted images demonstrate an intact craniocervical
junction, intact dens and intact lateral masses throughout.
IMPRESSION: 1. Normal unenhanced head CT.
2. Normal CT of the cervical spine.

## 2015-01-02 ENCOUNTER — Other Ambulatory Visit: Payer: Self-pay

## 2015-01-02 NOTE — Telephone Encounter (Signed)
Pt left v/m requesting rx for Adderall. Call when ready for pick up.rx last printed # 30 on 12/02/14 and seen 09/02/14.

## 2015-01-03 MED ORDER — AMPHETAMINE-DEXTROAMPHET ER 30 MG PO CP24: 30.0000 mg | ORAL_CAPSULE | ORAL | Status: DC

## 2015-01-03 NOTE — Telephone Encounter (Signed)
RX printed and signed and placed in MYD box 

## 2015-01-04 NOTE — Telephone Encounter (Signed)
Rx left in front office for pick up and pt is aware  

## 2015-02-06 ENCOUNTER — Other Ambulatory Visit: Payer: Self-pay

## 2015-02-06 NOTE — Telephone Encounter (Signed)
Pt left v/m requesting rx for Adderall. Call when ready for pick up. Pt last seen 09/02/2014 and rx last printed # 30 on 01/03/15.Please advise.

## 2015-02-07 MED ORDER — AMPHETAMINE-DEXTROAMPHET ER 30 MG PO CP24: 30.0000 mg | ORAL_CAPSULE | ORAL | Status: DC

## 2015-02-07 NOTE — Telephone Encounter (Signed)
RX printed and signed and placed in MYD box 

## 2015-02-07 NOTE — Telephone Encounter (Signed)
Rx left in front office for pick up and pt is aware  

## 2015-02-13 ENCOUNTER — Telehealth: Payer: Self-pay

## 2015-02-13 NOTE — Telephone Encounter (Signed)
Pt request call to [facility] so pt can get adderall. Pt request cb with update on PA for adderall.

## 2015-02-15 NOTE — Telephone Encounter (Signed)
Rx has to go through mail order now per insurance pt says or they will no longer pay---so he maybe asking for refills sooner than needed as he has to make sure he does not run out

## 2015-03-20 ENCOUNTER — Telehealth: Payer: Self-pay

## 2015-03-20 DIAGNOSIS — F988 Other specified behavioral and emotional disorders with onset usually occurring in childhood and adolescence: Secondary | ICD-10-CM

## 2015-03-20 NOTE — Telephone Encounter (Signed)
Pt returned your call [phone]

## 2015-03-20 NOTE — Telephone Encounter (Addendum)
Pt left v/m; pt spoke with someone about his adderall rx few weeks ago; pt only has 3 pills of adderall left; pt wants to know if rx has been sent to mail order pharmacy or mailed to pt. Pt request cb.

## 2015-03-20 NOTE — Telephone Encounter (Signed)
Ok to refill 

## 2015-03-20 NOTE — Telephone Encounter (Signed)
Last filled 02/07/2015---please advise if okay to refill--pt's insurance is requiring him to use [facility] mail order or they will not cover--I advised pt to call 7-10 days before he runs out of meds to request for refill and allow time to fax Rx and for it to be mailed to hime

## 2015-03-20 NOTE — Telephone Encounter (Signed)
Left detailed msg on VM per HIPAA I spoke to pt last month and advised him if we were to send to mail order he will need to give [place] a much greater notice as it will take more time for mail order turn around---lm asking pt to what he would want me to do with Rx as we have no way of knowing when he runs out unless he tells [place] and request for a refill much sooner than before when he was picking up Rx

## 2015-03-21 MED ORDER — AMPHETAMINE-DEXTROAMPHET ER 30 MG PO CP24: 30.0000 mg | ORAL_CAPSULE | ORAL | Status: DC

## 2015-03-21 NOTE — Addendum Note (Signed)
Addended by: Roena Malady on: 03/21/2015 02:58 PM   Modules accepted: Orders

## 2015-03-22 NOTE — Telephone Encounter (Signed)
Mail order is not required called [facility] and completed PA via telephone for quantity limit--Ref# [id number]--[id number] response

## 2015-03-22 NOTE — Telephone Encounter (Signed)
Spoke with and informed him that i would have to talk to [facility] I was directed to speak to [facility]---I was informed there was a quantity limit PA required--PA submitted via phone and awaiting response--Ref #[id number]

## 2015-03-23 MED ORDER — AMPHETAMINE-DEXTROAMPHET ER 30 MG PO CP24: 30.0000 mg | ORAL_CAPSULE | ORAL | Status: DC

## 2015-03-23 NOTE — Telephone Encounter (Signed)
Spoke to [employer] and [facility]---pt will have to do mail order and I will have to mail Rx to [facility]---pt is aware---pt is out of meds and 2nd Rx has been placed in the front office for pt to take to local pharmacy as the mail order will take 2-4 weeks to process  Mailed to [facility] [address] [place], [place] [address]  [phone]

## 2015-03-23 NOTE — Telephone Encounter (Signed)
Spoke to [place] with [facility] and Rx is mail order only

## 2015-03-23 NOTE — Addendum Note (Signed)
Addended by: Roena Malady on: 03/23/2015 04:07 PM   Modules accepted: Orders

## 2015-04-06 ENCOUNTER — Ambulatory Visit (INDEPENDENT_AMBULATORY_CARE_PROVIDER_SITE_OTHER): Payer: 59 | Admitting: Physician Assistant

## 2015-04-06 VITALS — BP 117/84 | HR 84 | Temp 98.5°F | Resp 18 | Ht 69.5 in | Wt 213.2 lb

## 2015-04-06 DIAGNOSIS — J069 Acute upper respiratory infection, unspecified: Secondary | ICD-10-CM | POA: Diagnosis not present

## 2015-04-06 DIAGNOSIS — B9789 Other viral agents as the cause of diseases classified elsewhere: Principal | ICD-10-CM

## 2015-04-06 MED ORDER — IPRATROPIUM BROMIDE 0.06 % NA SOLN: 2.0000 | Freq: Three times a day (TID) | NASAL | Status: DC

## 2015-04-06 MED ORDER — GUAIFENESIN ER 1200 MG PO TB12: 1.0000 | ORAL_TABLET | Freq: Two times a day (BID) | ORAL | Status: AC

## 2015-04-06 MED ORDER — HYDROCOD POLST-CPM POLST ER 10-8 MG/5ML PO SUER: 5.0000 mL | Freq: Two times a day (BID) | ORAL | Status: DC | PRN

## 2015-04-06 NOTE — Progress Notes (Signed)
   Edward Bentley  MRN: [id number] DOB: [birth year]  Subjective:  Pt presents to clinic with cold symptoms for the last 2 days.  He went to [place] and flew home and then the symptoms started right after that.  He feels terrible.  He has been using OTC cold preps but does not feel like they are really helping that much.  He started with a sore throat and now he mainly has congestion with PND and a cough that is keeping him up at night.  The friend he went with has the same similar symptoms.  Home treatments - cold preps -   Patient Active Problem List   Diagnosis Date Noted  . Tinea versicolor 12/03/2014  . ADD (attention deficit disorder) 09/02/2014    Current Outpatient Prescriptions on File Prior to Visit  Medication Sig Dispense Refill  . amphetamine-dextroamphetamine (ADDERALL XR) 30 MG 24 hr capsule Take 1 capsule (30 mg total) by mouth every morning. 30 capsule 0   No current facility-administered medications on file prior to visit.    No Known Allergies  Review of Systems  Constitutional: Negative for fever and chills.  HENT: Positive for congestion, rhinorrhea (green) and sore throat (initial symptoms). Negative for postnasal drip.   Respiratory: Positive for cough (green in the am and then dry as the day goes on).        No h/o asthma, nonsmoker  Musculoskeletal: Positive for myalgias (improved today compared with yesterday).  Neurological: Positive for headaches (intermittent).   Objective:  BP 117/84 mmHg  Pulse 84  Temp(Src) 98.5 F (36.9 C) (Oral)  Resp 18  Ht 5' 9.5" (1.765 m)  Wt 213 lb 3.2 oz (96.707 kg)  BMI 31.04 kg/m2  SpO2 98%  Physical Exam  Constitutional: He is oriented to person, place, and time and well-developed, well-nourished, and in no distress.     HENT:  Head: Normocephalic and atraumatic.  Right Ear: Hearing, tympanic membrane, external ear and ear canal normal.  Left Ear: Hearing, tympanic membrane, external ear and ear canal  normal.  Nose: Nose normal.  Mouth/Throat: Uvula is midline, oropharynx is clear and moist and mucous membranes are normal.  Eyes: Conjunctivae are normal.  Neck: Normal range of motion.  Cardiovascular: Normal rate, regular rhythm and normal heart sounds.   Pulmonary/Chest: Effort normal and breath sounds normal. He has no wheezes.  Lymphadenopathy:       Head (right side): No tonsillar adenopathy present.       Head (left side): No tonsillar adenopathy present.    He has no cervical adenopathy.       Right: No supraclavicular adenopathy present.       Left: No supraclavicular adenopathy present.  Neurological: He is alert and oriented to person, place, and time. Gait normal.  Skin: Skin is warm and dry.  Psychiatric: Mood, memory, affect and judgment normal.    Assessment and Plan :  Viral URI with cough - Plan: chlorpheniramine-HYDROcodone (TUSSIONEX PENNKINETIC ER) 10-8 MG/5ML SUER, ipratropium (ATROVENT) 0.06 % nasal spray, Guaifenesin (MUCINEX MAXIMUM STRENGTH) 1200 MG TB12  Symptomatic treatment  Benny Lennert PA-C  [facility] 04/06/2015 7:09 PM

## 2015-04-20 ENCOUNTER — Other Ambulatory Visit: Payer: Self-pay | Admitting: *Deleted

## 2015-04-20 DIAGNOSIS — F988 Other specified behavioral and emotional disorders with onset usually occurring in childhood and adolescence: Secondary | ICD-10-CM

## 2015-04-20 NOTE — Telephone Encounter (Signed)
Pt left voicemail at Triage. Pt requested that we fax a Rx to his mail order pharmacy for his adderall so the mail order pharmacy will mail it to him.  I called pt back and left voicemail letting him know I sent this request to Nicki Reaperegina Baity, NP but if she approves it he has to pick it up here at the office, we can't fax Rx  Rx last filled on 03/23/15 #30 with 0 refills

## 2015-04-20 NOTE — Telephone Encounter (Signed)
Spoke with Shawna OrleansMelanie and she mailed a Rx to his mail order pharmacy already. I advise pt that Rx was mailed already and he needs to call his mail order pharmacy to get them to send it Rx to him. Pt will check with his mail order pharmacy to make sure they received his Rx and if he has any issues he will call us back

## 2015-05-18 ENCOUNTER — Other Ambulatory Visit: Payer: Self-pay

## 2015-05-18 DIAGNOSIS — F988 Other specified behavioral and emotional disorders with onset usually occurring in childhood and adolescence: Secondary | ICD-10-CM

## 2015-05-18 NOTE — Telephone Encounter (Signed)
We can mail it but make him aware it will not be a 90 day supply

## 2015-05-18 NOTE — Telephone Encounter (Signed)
Pt left v/m requesting adderall rx be [facility]. Pt request cb that rx mailed. Last printed # 30 on 03/23/15. Last seen 09/02/14.

## 2015-05-19 MED ORDER — AMPHETAMINE-DEXTROAMPHET ER 30 MG PO CP24: 30.0000 mg | ORAL_CAPSULE | ORAL | Status: DC

## 2015-05-19 NOTE — Telephone Encounter (Signed)
I mailed the last Rx after the first Rx we should not have to mail every month---lmovm for pt to return call as if it needs to be mailed monthly he will have to pick up Rx---also asked if he will call [facility] to find out how this can be avoided if it can

## 2015-05-19 NOTE — Telephone Encounter (Signed)
Pt returned Melanie's phone call. Best call back # is [phone].

## 2015-05-19 NOTE — Telephone Encounter (Signed)
Pt is planning on changing to his own insurance off of [employer]---can you print 2 mth supply 2 separate Rx and he will pick up to mail himself today

## 2015-06-09 ENCOUNTER — Emergency Department (HOSPITAL_COMMUNITY)
Admission: EM | Admit: 2015-06-09 | Discharge: 2015-06-09 | Disposition: A | Discharge status: Home / Self Care | Payer: 59 | Source: Home / Self Care

## 2015-06-09 ENCOUNTER — Encounter (HOSPITAL_COMMUNITY): Payer: Self-pay | Admitting: Emergency Medicine

## 2015-06-09 DIAGNOSIS — J020 Streptococcal pharyngitis: Secondary | ICD-10-CM | POA: Diagnosis not present

## 2015-06-09 DIAGNOSIS — R059 Cough, unspecified: Secondary | ICD-10-CM

## 2015-06-09 DIAGNOSIS — K644 Residual hemorrhoidal skin tags: Secondary | ICD-10-CM

## 2015-06-09 DIAGNOSIS — K648 Other hemorrhoids: Secondary | ICD-10-CM | POA: Diagnosis not present

## 2015-06-09 DIAGNOSIS — R05 Cough: Secondary | ICD-10-CM | POA: Diagnosis not present

## 2015-06-09 LAB — POCT RAPID STREP A: STREPTOCOCCUS, GROUP A SCREEN (DIRECT): POSITIVE — AB

## 2015-06-09 LAB — OCCULT BLOOD, POC DEVICE: Fecal Occult Bld: NEGATIVE

## 2015-06-09 MED ORDER — AMOXICILLIN 500 MG PO CAPS: 500.0000 mg | ORAL_CAPSULE | Freq: Three times a day (TID) | ORAL | Status: DC

## 2015-06-09 NOTE — ED Notes (Signed)
Here with URI sx's cough, congestion, post nasal drainage with greenish- yelow phlegm that started Wednesday Taking Mucinex with minimal relief Denies SOB, chest pain  Also c/o 3 episodes of bloody stools and with wiping, no associated pain reported  Denies family Hx Colon CA

## 2015-06-09 NOTE — Telephone Encounter (Signed)
Form received from [facility] requesting supervising physician---form faxed back to [facility]---pt told me he was going to get Rx locally from last refill

## 2015-06-09 NOTE — Discharge Instructions (Signed)
Take full course of antibiotics, even if symptoms improve. For cough, can take Robutussin-DM or Nyquil, whichever works better. Can take OTC Flonase for nasal discharge. Continue supportive treatments of fluids and salt water gargles. External hemorrhoid should improve on its own. If bleeding continues, follow up with PCP.   Hemorrhoids Hemorrhoids are swollen veins around the rectum or anus. There are two types of hemorrhoids:   Internal hemorrhoids. These occur in the veins just inside the rectum. They may poke through to the outside and become irritated and painful.  External hemorrhoids. These occur in the veins outside the anus and can be felt as a painful swelling or hard lump near the anus. CAUSES  Pregnancy.   Obesity.   Constipation or diarrhea.   Straining to have a bowel movement.   Sitting for long periods on the toilet.  Heavy lifting or other activity that caused you to strain.  Anal intercourse. SYMPTOMS   Pain.   Anal itching or irritation.   Rectal bleeding.   Fecal leakage.   Anal swelling.   One or more lumps around the anus.  DIAGNOSIS  Your caregiver may be able to diagnose hemorrhoids by visual examination. Other examinations or tests that may be performed include:   Examination of the rectal area with a gloved hand (digital rectal exam).   Examination of anal canal using a small tube (scope).   A blood test if you have lost a significant amount of blood.  A test to look inside the colon (sigmoidoscopy or colonoscopy). TREATMENT Most hemorrhoids can be treated at home. However, if symptoms do not seem to be getting better or if you have a lot of rectal bleeding, your caregiver may perform a procedure to help make the hemorrhoids get smaller or remove them completely. Possible treatments include:   Placing a rubber band at the base of the hemorrhoid to cut off the circulation (rubber band ligation).   Injecting a chemical to shrink  the hemorrhoid (sclerotherapy).   Using a tool to burn the hemorrhoid (infrared light therapy).   Surgically removing the hemorrhoid (hemorrhoidectomy).   Stapling the hemorrhoid to block blood flow to the tissue (hemorrhoid stapling).  HOME CARE INSTRUCTIONS   Eat foods with fiber, such as whole grains, beans, nuts, fruits, and vegetables. Ask your doctor about taking products with added fiber in them (fibersupplements).  Increase fluid intake. Drink enough water and fluids to keep your urine clear or pale yellow.   Exercise regularly.   Go to the bathroom when you have the urge to have a bowel movement. Do not wait.   Avoid straining to have bowel movements.   Keep the anal area dry and clean. Use wet toilet paper or moist towelettes after a bowel movement.   Medicated creams and suppositories may be used or applied as directed.   Only take over-the-counter or prescription medicines as directed by your caregiver.   Take warm sitz baths for 15-20 minutes, 3-4 times a day to ease pain and discomfort.   Place ice packs on the hemorrhoids if they are tender and swollen. Using ice packs between sitz baths may be helpful.   Put ice in a plastic bag.   Place a towel between your skin and the bag.   Leave the ice on for 15-20 minutes, 3-4 times a day.   Do not use a donut-shaped pillow or sit on the toilet for long periods. This increases blood pooling and pain.  SEEK MEDICAL CARE IF:  You have increasing pain and swelling that is not controlled by treatment or medicine.  You have uncontrolled bleeding.  You have difficulty or you are unable to have a bowel movement.  You have pain or inflammation outside the area of the hemorrhoids. MAKE SURE YOU:  Understand these instructions.  Will watch your condition.  Will get help right away if you are not doing well or get worse.   This information is not intended to replace advice given to you by your health  care provider. Make sure you discuss any questions you have with your health care provider.   Document Released: 06/21/2000 Document Revised: 06/10/2012 Document Reviewed: 04/28/2012 Elsevier Interactive Patient Education 2016 Elsevier Inc. Cough, Adult Coughing is a reflex that clears your throat and your airways. Coughing helps to heal and protect your lungs. It is normal to cough occasionally, but a cough that happens with other symptoms or lasts a long time may be a sign of a condition that needs treatment. A cough may last only 2-3 weeks (acute), or it may last longer than 8 weeks (chronic). CAUSES Coughing is commonly caused by:  Breathing in substances that irritate your lungs.  A viral or bacterial respiratory infection.  Allergies.  Asthma.  Postnasal drip.  Smoking.  Acid backing up from the stomach into the esophagus (gastroesophageal reflux).  Certain medicines.  Chronic lung problems, including COPD (or rarely, lung cancer).  Other medical conditions such as heart failure. HOME CARE INSTRUCTIONS  Pay attention to any changes in your symptoms. Take these actions to help with your discomfort:  Take medicines only as told by your health care provider.  If you were prescribed an antibiotic medicine, take it as told by your health care provider. Do not stop taking the antibiotic even if you start to feel better.  Talk with your health care provider before you take a cough suppressant medicine.  Drink enough fluid to keep your urine clear or pale yellow.  If the air is dry, use a cold steam vaporizer or humidifier in your bedroom or your home to help loosen secretions.  Avoid anything that causes you to cough at work or at home.  If your cough is worse at night, try sleeping in a semi-upright position.  Avoid cigarette smoke. If you smoke, quit smoking. If you need help quitting, ask your health care provider.  Avoid caffeine.  Avoid alcohol.  Rest as  needed. SEEK MEDICAL CARE IF:   You have new symptoms.  You cough up pus.  Your cough does not get better after 2-3 weeks, or your cough gets worse.  You cannot control your cough with suppressant medicines and you are losing sleep.  You develop pain that is getting worse or pain that is not controlled with pain medicines.  You have a fever.  You have unexplained weight loss.  You have night sweats. SEEK IMMEDIATE MEDICAL CARE IF:  You cough up blood.  You have difficulty breathing.  Your heartbeat is very fast.   This information is not intended to replace advice given to you by your health care provider. Make sure you discuss any questions you have with your health care provider.   Document Released: 12/21/2010 Document Revised: 03/15/2015 Document Reviewed: 08/31/2014 Elsevier Interactive Patient Education 2016 Elsevier Inc. Strep Throat Strep throat is a bacterial infection of the throat. Your health care provider may call the infection tonsillitis or pharyngitis, depending on whether there is swelling in the tonsils or at the back  of the throat. Strep throat is most common during the cold months of the year in children who are 425-27 years of age, but it can happen during any season in people of any age. This infection is spread from person to person (contagious) through coughing, sneezing, or close contact. CAUSES Strep throat is caused by the bacteria called Streptococcus pyogenes. RISK FACTORS This condition is more likely to develop in:  People who spend time in crowded places where the infection can spread easily.  People who have close contact with someone who has strep throat. SYMPTOMS Symptoms of this condition include:  Fever or chills.   Redness, swelling, or pain in the tonsils or throat.  Pain or difficulty when swallowing.  White or yellow spots on the tonsils or throat.  Swollen, tender glands in the neck or under the jaw.  Red rash all over the  body (rare). DIAGNOSIS This condition is diagnosed by performing a rapid strep test or by taking a swab of your throat (throat culture test). Results from a rapid strep test are usually ready in a few minutes, but throat culture test results are available after one or two days. TREATMENT This condition is treated with antibiotic medicine. HOME CARE INSTRUCTIONS Medicines  Take over-the-counter and prescription medicines only as told by your health care provider.  Take your antibiotic as told by your health care provider. Do not stop taking the antibiotic even if you start to feel better.  Have family members who also have a sore throat or fever tested for strep throat. They may need antibiotics if they have the strep infection. Eating and Drinking  Do not share food, drinking cups, or personal items that could cause the infection to spread to other people.  If swallowing is difficult, try eating soft foods until your sore throat feels better.  Drink enough fluid to keep your urine clear or pale yellow. General Instructions  Gargle with a salt-water mixture 3-4 times per day or as needed. To make a salt-water mixture, completely dissolve -1 tsp of salt in 1 cup of warm water.  Make sure that all household members wash their hands well.  Get plenty of rest.  Stay home from school or work until you have been taking antibiotics for 24 hours.  Keep all follow-up visits as told by your health care provider. This is important. SEEK MEDICAL CARE IF:  The glands in your neck continue to get bigger.  You develop a rash, cough, or earache.  You cough up a thick liquid that is green, yellow-brown, or bloody.  You have pain or discomfort that does not get better with medicine.  Your problems seem to be getting worse rather than better.  You have a fever. SEEK IMMEDIATE MEDICAL CARE IF:  You have new symptoms, such as vomiting, severe headache, stiff or painful neck, chest pain, or  shortness of breath.  You have severe throat pain, drooling, or changes in your voice.  You have swelling of the neck, or the skin on the neck becomes red and tender.  You have signs of dehydration, such as fatigue, dry mouth, and decreased urination.  You become increasingly sleepy, or you cannot wake up completely.  Your joints become red or painful.   This information is not intended to replace advice given to you by your health care provider. Make sure you discuss any questions you have with your health care provider.   Document Released: 06/21/2000 Document Revised: 03/15/2015 Document Reviewed: 10/17/2014 Elsevier  Interactive Patient Education ©2016 Elsevier Inc. ° °

## 2015-06-09 NOTE — Telephone Encounter (Signed)
Pt left v/m that [facility] Adder[facility]; needs new rx with R Baity NP's supervisor on the prescription.  Pt request cb.

## 2015-06-09 NOTE — ED Provider Notes (Signed)
CSN: [id number]     Arrival date & time 06/09/15  1301 History   None    Chief Complaint  Patient presents with  . URI   (Consider location/radiation/quality/duration/timing/severity/associated sxs/prior Treatment) HPI  Laurence Spateslexander Reid Micco is a 29 y.o. male presenting with 5 days of sore throat and 3 days of worsening cough, nasal congestion, and postnasal drip. Cough is constant and occasionally productive of yellow-green sputum. Cough is keeping him up at night. Partially relieved with Mucinex, Robitussin, and cough drops. Sore throat is constant and worse with swallowing. Pain is 6/10. Patient also with blood with bowel movements x4 episodes. No episodes today. Patient noticed bright red blood on the toilet paper and in the toilet bowl without clots. Denies change in bowel habits. Usually has bowel movements twice daily. No history of crohn's, UC, hemorrhoids, or diverticulitis. Denies known fever, nausea, vomiting, diarrhea, constipation, strain with bowel movements, myalgias, and rash.  Past Medical History  Diagnosis Date  . ADD (attention deficit disorder)    History reviewed. No pertinent past surgical history. Family History  Problem Relation Age of Onset  . Hypertension Mother   . Hypertension Father   . Heart disease Maternal Grandfather   . Alcohol abuse Paternal Grandfather   . Cancer Neg Hx    Social History  Substance Use Topics  . Smoking status: Never Smoker   . Smokeless tobacco: Never Used  . Alcohol Use: 0.0 oz/week    0 Standard drinks or equivalent per week     Comment: occasional--social    Review of Systems  Constitutional: Negative for fever, chills and appetite change.  HENT: Positive for congestion, ear pain, postnasal drip, sore throat and voice change. Negative for sinus pressure and trouble swallowing.   Eyes: Negative for redness and visual disturbance.  Respiratory: Positive for cough. Negative for chest tightness, shortness of breath and  wheezing.   Cardiovascular: Negative for chest pain and leg swelling.  Gastrointestinal: Positive for blood in stool. Negative for nausea, vomiting, diarrhea and constipation.  Genitourinary: Negative for dysuria and hematuria.  Musculoskeletal: Negative for myalgias, back pain and neck stiffness.  Skin: Negative for rash.  Neurological: Negative for dizziness, syncope, numbness and headaches.    Allergies  Review of patient's allergies indicates no known allergies.  Home Medications   Prior to Admission medications   Medication Sig Start Date End Date Taking? Authorizing Provider  amoxicillin (AMOXIL) 500 MG capsule Take 1 capsule (500 mg total) by mouth 3 (three) times daily. 06/09/15   Tharon AquasFrank C Paulla Mcclaskey, PA  amphetamine-dextroamphetamine (ADDERALL XR) 30 MG 24 hr capsule Take 1 capsule (30 mg total) by mouth every morning. 05/19/15   Lorre Munroeegina W Baity, NP  chlorpheniramine-HYDROcodone [facility](TUSSIONEX PENNKINETIC ER) 10-8 MG/5ML SUER Take 5 mLs by mouth 2 (two) times daily as needed for cough. 04/06/15   Morrell RiddleSarah L Weber, PA-C  ipratropium (ATROVENT) 0.06 % nasal spray Place 2 sprays into the nose 3 (three) times daily. 04/06/15   Morrell RiddleSarah L Weber, PA-C   Meds Ordered and Administered this Visit  Medications - No data to display  BP 140/94 mmHg  Pulse 101  Temp(Src) 99 F (37.2 C) (Oral)  Resp 16  SpO2 98% No data found.   Physical Exam  Constitutional: He is oriented to person, place, and time. He appears well-developed and well-nourished. No distress.  HENT:  Head: Normocephalic and atraumatic.  Right Ear: Tympanic membrane and ear canal normal.  Left Ear: Tympanic membrane is erythematous. Tympanic membrane mobility is  abnormal.  Nose: Mucosal edema and rhinorrhea present. Right sinus exhibits no maxillary sinus tenderness and no frontal sinus tenderness. Left sinus exhibits no maxillary sinus tenderness and no frontal sinus tenderness.  Mouth/Throat: Uvula is midline and mucous membranes  are normal. Posterior oropharyngeal edema and posterior oropharyngeal erythema present. No oropharyngeal exudate or tonsillar abscesses.  Eyes: Conjunctivae are normal.  Neck: Normal range of motion. Neck supple.  Cardiovascular: Normal rate, regular rhythm and normal heart sounds.   Pulmonary/Chest: Effort normal and breath sounds normal. He has no wheezes.  Abdominal: Soft. Bowel sounds are normal. He exhibits no distension and no mass. There is no tenderness. There is no rebound and no guarding.  Genitourinary: Rectal exam shows external hemorrhoid (12 oclock position). Rectal exam shows no internal hemorrhoid, no fissure, no mass and no tenderness. Guaiac negative stool.  Musculoskeletal: Normal range of motion.  Lymphadenopathy:    He has cervical adenopathy.  Neurological: He is alert and oriented to person, place, and time.  Skin: Skin is warm and dry. No rash noted. He is not diaphoretic.  Psychiatric: He has a normal mood and affect. His behavior is normal.    ED Course  Procedures (including critical care time)  Labs Review Labs Reviewed  POCT RAPID STREP A - Abnormal; Notable for the following:    Streptococcus, Group A Screen (Direct) POSITIVE (*)    All other components within normal limits  OCCULT BLOOD, POC DEVICE    Imaging Review No results found.   Visual Acuity Review  Right Eye Distance:   Left Eye Distance:   Bilateral Distance:    Right Eye Near:   Left Eye Near:    Bilateral Near:      MDM   1. Strep pharyngitis   2. Cough   3. External hemorrhoid    29 yo male with sore throat x 5 days and nasal congestion/cough for 3 days. Temperature 42F (oral) while in urgent care. Rapid strep positive. Rx Amoxicillin 500 mg TID x 7 days.  Bleeding with bowel movements x 4 episodes, with none today. No history of UC, crohn's, or hemorrhoids. Rectal exam shows external hemorrhoid at 12 o'clock position. Fecal occult blood negative. Discussed high fiber diet,  drinking plenty of fluids, and avoiding strain with bowel movements. If problem returns, follow up with PCP.   For cough, can continue supportive treatments of Mucinex and OTC cough syrups. If Robitussin does not work, can try Nyquil.   Follow up with PCP if symptoms continue. Patient voices no other complaints at this time.    Tharon Aquas, PA 06/09/15 [phone]

## 2015-06-09 NOTE — Telephone Encounter (Signed)
He has been given RX through 1/11. Are we able to get those RX back?

## 2015-07-04 ENCOUNTER — Other Ambulatory Visit: Payer: Self-pay

## 2015-07-04 DIAGNOSIS — F988 Other specified behavioral and emotional disorders with onset usually occurring in childhood and adolescence: Secondary | ICD-10-CM

## 2015-07-04 NOTE — Telephone Encounter (Signed)
Pt left v/m requesting rx for Adderall. Call when ready for pick up. Last printed # 30 on 05/19/15. Last seen 09/02/14.

## 2015-07-05 MED ORDER — AMPHETAMINE-DEXTROAMPHET ER 30 MG PO CP24: 30.0000 mg | ORAL_CAPSULE | ORAL | Status: DC

## 2015-07-05 NOTE — Telephone Encounter (Signed)
RX printed and signed and placed in MYD box 

## 2015-07-05 NOTE — Telephone Encounter (Signed)
Rx left in front office for pick up and pt is aware  

## 2015-08-31 ENCOUNTER — Other Ambulatory Visit: Payer: Self-pay

## 2015-08-31 DIAGNOSIS — F988 Other specified behavioral and emotional disorders with onset usually occurring in childhood and adolescence: Secondary | ICD-10-CM

## 2015-08-31 MED ORDER — AMPHETAMINE-DEXTROAMPHET ER 30 MG PO CP24: 30.0000 mg | ORAL_CAPSULE | ORAL | Status: DC

## 2015-08-31 NOTE — Telephone Encounter (Signed)
RX printed and signed and placed in MYD box 

## 2015-08-31 NOTE — Telephone Encounter (Signed)
Pt left v/m requesting rx for Adderall. Call when ready for pick up. rx printed last # 30 on 07/05/2015. Last seen 09/02/14 to establish care; no future appt scheduled. Pt wants to pick up on 09/01/15.

## 2015-08-31 NOTE — Telephone Encounter (Signed)
Rx left in front office for pick up and pt is aware Also made pt aware that it is time for annual physical--last CPE was 09/02/2014---pt expressed understanding

## 2015-09-08 ENCOUNTER — Encounter: Payer: Self-pay | Admitting: Internal Medicine

## 2015-10-02 ENCOUNTER — Ambulatory Visit: Payer: Self-pay | Admitting: Surgery

## 2015-10-02 NOTE — H&P (Signed)
Alyson ReedyAlexander R. Alferd ApaMoseley 10/02/2015 11:50 AM Location: [facility] Patient #: [id number] DOB: 10-09-1985 Married / Language: [place] / Race: White Male  History of Present Illness Maisie Fus(Jeweline Reif A. Stacye Noori MD; 10/02/2015 12:16 PM) Patient words: pt sent at the request of Nicki Reaperegina Baity NP for right inguinal hernia present for 4 - 5 years.  The patient is a 30 year old male who presents with an inguinal hernia. The hernia(s) is/are located on the right side. Symptoms include inguinal bulge and inguinal pain. The pain is located in the right inguinal area. There is no radiation. The patient describes the pain as dull. There is no known event that preceded symptom onset. The patient describes this as mild and worsening. Symptoms are exacerbated by lifting.  HE POST PONED SURGERY LAST YEAR BUT WANTS IT DONE SINCE HE IS MORE SYMPTOMATIC.  The patient is a 30 year old male.   Allergies ([name], CMA; 10/02/2015 11:51 AM) No Known Drug Allergies 09/19/2014  Medication History ([name], CMA; 10/02/2015 11:51 AM) Amphetamine-Dextroamphet ER (10MG  Capsule ER 24HR, Oral) Active. Medications Reconciled    Vitals ([name] CMA; 10/02/2015 11:51 AM) 10/02/2015 11:50 AM Weight: 218 lb Height: 70in Body Surface Area: 2.17 m Body Mass Index: 31.28 kg/m  Pulse: 79 (Regular)  BP: 126/80 (Sitting, Left Arm, Standard)      Physical Exam (Osiris Charles A. Rayshawn Visconti MD; 10/02/2015 12:16 PM)  General Mental Status-Alert. General Appearance-Consistent with stated age. Hydration-Well hydrated. Voice-Normal.  Chest and Lung Exam Chest and lung exam reveals -quiet, even and easy respiratory effort with no use of accessory muscles and on auscultation, normal breath sounds, no adventitious sounds and normal vocal resonance. Inspection Chest Wall - Normal. Back - normal.  Cardiovascular Cardiovascular examination reveals -on palpation PMI is normal in location and amplitude, no  palpable S3 or S4. Normal cardiac borders., normal heart sounds, regular rate and rhythm with no murmurs, carotid auscultation reveals no bruits and normal pedal pulses bilaterally.  Abdomen Note: REDCIBLE rih SOFT MILD DISCOMFORT LARGER  Musculoskeletal Normal Exam - Left-Upper Extremity Strength Normal and Lower Extremity Strength Normal. Normal Exam - Right-Upper Extremity Strength Normal, Lower Extremity Weakness.    Assessment & Plan (Aneli Zara A. Caiden Monsivais MD; 10/02/2015 12:17 PM)  RIGHT INGUINAL HERNIA (K40.90) Impression: discussed open and laparoscopic repair with mesh. pt would like to proceed with open right inguinal hernia rpair. The risk of hernia repair include bleeding, infection, organ injury, bowel injury, bladder injury, nerve injury recurrent hernia, blood clots, worsening of underlying condition, chronic pain, mesh use, open surgery, death, and the need for other operattions. Pt agrees to proceed  Current Plans The anatomy & physiology of the abdominal wall and pelvic floor was discussed. The pathophysiology of hernias in the inguinal and pelvic region was discussed. Natural history risks such as progressive enlargement, pain, incarceration, and strangulation was discussed. Contributors to complications such as smoking, obesity, diabetes, prior surgery, etc were discussed.  I feel the risks of no intervention will lead to serious problems that outweigh the operative risks; therefore, I recommended surgery to reduce and repair the hernia. I explained laparoscopic techniques with possible need for an open approach. I noted usual use of mesh to patch and/or buttress hernia repair  Risks such as bleeding, infection, abscess, need for further treatment, heart attack, death, and other risks were discussed. I noted a good likelihood this will help address the problem. Goals of post-operative recovery were discussed as well. Possibility that this will not correct all symptoms was  explained. I stressed the importance of low-impact activity, aggressive pain control, avoiding constipation, & not pushing through pain to minimize risk of post-operative chronic pain or injury. Possibility of reherniation was discussed. We will work to minimize complications.  An educational handout further explaining the pathology & treatment options was given as well. Questions were answered. The patient expresses understanding & wishes to proceed with surgery.  Pt Education - [facility] Umbilical/ Inguinal Hernia HCI

## 2015-10-17 ENCOUNTER — Other Ambulatory Visit: Payer: Self-pay

## 2015-10-17 DIAGNOSIS — F988 Other specified behavioral and emotional disorders with onset usually occurring in childhood and adolescence: Secondary | ICD-10-CM

## 2015-10-17 NOTE — Telephone Encounter (Signed)
Pt left v/m requesting rx for Adderall. Call when ready for pick up. rx last printed # 30 on 08/31/15. Pt last seen for ADD 09/02/14. No future appt scheduled.Please advise.

## 2015-10-18 MED ORDER — AMPHETAMINE-DEXTROAMPHET ER 30 MG PO CP24: 30.0000 mg | ORAL_CAPSULE | ORAL | Status: DC

## 2015-10-18 NOTE — Telephone Encounter (Signed)
Left voicemail letting pt know Rx ready for pick up 

## 2015-10-18 NOTE — Telephone Encounter (Signed)
RX printed and signed and placed in MYD box 

## 2015-11-21 ENCOUNTER — Other Ambulatory Visit: Payer: Self-pay | Admitting: *Deleted

## 2015-11-21 DIAGNOSIS — F988 Other specified behavioral and emotional disorders with onset usually occurring in childhood and adolescence: Secondary | ICD-10-CM

## 2015-11-21 MED ORDER — AMPHETAMINE-DEXTROAMPHET ER 30 MG PO CP24: 30.0000 mg | ORAL_CAPSULE | ORAL | Status: DC

## 2015-11-21 NOTE — Telephone Encounter (Signed)
Pt left voicemail at Triage, requesting a refill of med today before lunch because he wants to pick it up on his lunch break, last refilled on 10/18/15 #30 with 0 refills

## 2015-11-21 NOTE — Telephone Encounter (Signed)
Left detailed msg on VM per HIPAA Rx left in front office for pick up and pt is aware via VM 

## 2015-11-21 NOTE — Telephone Encounter (Signed)
RX printed and signed and placed in MYD box 

## 2016-01-05 ENCOUNTER — Other Ambulatory Visit: Payer: Self-pay

## 2016-01-05 DIAGNOSIS — F988 Other specified behavioral and emotional disorders with onset usually occurring in childhood and adolescence: Secondary | ICD-10-CM

## 2016-01-05 NOTE — Telephone Encounter (Signed)
Pt left v/m requesting rx for Adderall. Call when ready for pick up. rx last printed # 30 on 11/21/15. Pt last seen for Adderall rx 09/02/14. Does not have future appt scheduled.

## 2016-01-06 NOTE — Telephone Encounter (Signed)
Pt needs appt scheduled before RX will be refilled

## 2016-01-08 NOTE — Telephone Encounter (Signed)
Patient notified as instructed by telephone and appointment scheduled for 01/16/16 at 1:00 with Nicki Reaperegina Baity NP.

## 2016-01-11 NOTE — Telephone Encounter (Signed)
Pt's appt changed to CPE 11:15--pt is aware

## 2016-01-16 ENCOUNTER — Encounter: Payer: 59 | Admitting: Internal Medicine

## 2016-01-16 ENCOUNTER — Other Ambulatory Visit: Payer: Self-pay

## 2016-01-16 DIAGNOSIS — F988 Other specified behavioral and emotional disorders with onset usually occurring in childhood and adolescence: Secondary | ICD-10-CM

## 2016-01-16 MED ORDER — AMPHETAMINE-DEXTROAMPHET ER 30 MG PO CP24: 30.0000 mg | ORAL_CAPSULE | ORAL | Status: DC

## 2016-01-16 NOTE — Telephone Encounter (Signed)
Baity pt---pt had a CPE scheduled today and due to Rene KocherRegina being called for jury duty he will not be able to get his Rx for Adderall. I messaged Rene KocherRegina this morning and she said it will be okay to have another provider to refill for pt as he is out of meds---pt rescheduled CPE for 02/06/2016.... Patient wants to pick up Rx this afternoon---please let me know when ready--pt is aware

## 2016-01-16 NOTE — Telephone Encounter (Signed)
Rx left in front office for pick up and pt is aware  

## 2016-02-06 ENCOUNTER — Ambulatory Visit (INDEPENDENT_AMBULATORY_CARE_PROVIDER_SITE_OTHER): Payer: 59 | Admitting: Internal Medicine

## 2016-02-06 ENCOUNTER — Encounter: Payer: Self-pay | Admitting: Internal Medicine

## 2016-02-06 VITALS — BP 122/84 | HR 87 | Temp 97.8°F | Wt 227.0 lb

## 2016-02-06 DIAGNOSIS — K4090 Unilateral inguinal hernia, without obstruction or gangrene, not specified as recurrent: Secondary | ICD-10-CM | POA: Diagnosis not present

## 2016-02-06 DIAGNOSIS — R7989 Other specified abnormal findings of blood chemistry: Secondary | ICD-10-CM | POA: Diagnosis not present

## 2016-02-06 DIAGNOSIS — Z0000 Encounter for general adult medical examination without abnormal findings: Secondary | ICD-10-CM

## 2016-02-06 NOTE — Patient Instructions (Signed)

## 2016-02-06 NOTE — Progress Notes (Signed)
Subjective:    Patient ID: Edward Bentley, male    DOB: [birth year], 30 y.o.   MRN: [id number]  HPI  Pt presents to the clinic today for his annual exam.  Flu: never Tetanus: 02/2011 Dentist: as needed   Diet: He reports he outs a lot. He does eat meat. He consumes some fruits and veggies. He does eat fried food. He drinks water and soda. Exercise: He is not exercising at this time.  Review of Systems      Past Medical History:  Diagnosis Date  . ADD (attention deficit disorder)     Current Outpatient Prescriptions  Medication Sig Dispense Refill  . amphetamine-dextroamphetamine (ADDERALL XR) 30 MG 24 hr capsule Take 1 capsule (30 mg total) by mouth every morning. 30 capsule 0   No current facility-administered medications for this visit.     No Known Allergies  Family History  Problem Relation Age of Onset  . Hypertension Mother   . Hypertension Father   . Heart disease Maternal Grandfather   . Alcohol abuse Paternal Grandfather   . Cancer Neg Hx     Social History   Social History  . Marital status: Single    Spouse name: N/A  . Number of children: N/A  . Years of education: N/A   Occupational History  . Not on file.   Social History Main Topics  . Smoking status: Never Smoker  . Smokeless tobacco: Never Used  . Alcohol use 0.0 oz/week     Comment: occasional--social  . Drug use: No  . Sexual activity: Yes   Other Topics Concern  . Not on file   Social History Narrative  . No narrative on file     Constitutional: Denies fever, malaise, fatigue, headache or abrupt weight changes.  HEENT: Denies eye pain, eye redness, ear pain, ringing in the ears, wax buildup, runny nose, nasal congestion, bloody nose, or sore throat. Respiratory: Denies difficulty breathing, shortness of breath, cough or sputum production.   Cardiovascular: Denies chest pain, chest tightness, palpitations or swelling in the hands or feet.  Gastrointestinal: Denies  abdominal pain, bloating, constipation, diarrhea or blood in the stool.  GU: Pt reports inguinal hernia. Denies urgency, frequency, pain with urination, burning sensation, blood in urine, odor or discharge. Musculoskeletal: Denies decrease in range of motion, difficulty with gait, muscle pain or joint pain and swelling.  Skin: Denies redness, rashes, lesions or ulcercations.  Neurological: Denies dizziness, difficulty with memory, difficulty with speech or problems with balance and coordination.  Psych: Denies anxiety, depression, SI/HI.  No other specific complaints in a complete review of systems (except as listed in HPI above).  Objective:   Physical Exam   BP 122/84   Pulse 87   Temp 97.8 F (36.6 C) (Oral)   Wt 227 lb (103 kg)   SpO2 98%   BMI 33.04 kg/m  Wt Readings from Last 3 Encounters:  02/06/16 227 lb (103 kg)  04/06/15 213 lb 3.2 oz (96.7 kg)  12/03/14 213 lb (96.6 kg)    General: Appears his stated age, well developed, well nourished in NAD. Skin: Warm, dry and intact.  HEENT: Head: normal shape and size; Eyes: sclera white, no icterus, conjunctiva pink, PERRLA and EOMs intact; Ears: Tm's gray and intact, normal light reflex;Throat/Mouth: Teeth present, mucosa pink and moist, no exudate, lesions or ulcerations noted.  Neck:  Neck supple, trachea midline. No masses, lumps or thyromegaly present.  Cardiovascular: Normal rate and rhythm. S1,S2 noted.  No murmur, rubs or gallops noted. No JVD or BLE edema. No carotid bruits noted. Pulmonary/Chest: Normal effort and positive vesicular breath sounds. No respiratory distress. No wheezes, rales or ronchi noted.  Abdomen: Soft and nontender. Normal bowel sounds. No distention or masses noted. Liver, spleen and kidneys non palpable. Musculoskeletal: Normal range of motion. No signs of joint swelling. No difficulty with gait.  Neurological: Alert and oriented. Cranial nerves II-XII grossly intact. Coordination normal.    Psychiatric: Mood and affect normal. Behavior is normal. Judgment and thought content normal.    BMET    Component Value Date/Time   NA 138 09/02/2014 1654   K 4.2 09/02/2014 1654   CL 103 09/02/2014 1654   CO2 27 09/02/2014 1654   GLUCOSE 86 09/02/2014 1654   BUN 14 09/02/2014 1654   CREATININE 0.96 09/02/2014 1654   CALCIUM 9.6 09/02/2014 1654    Lipid Panel     Component Value Date/Time   CHOL 184 09/02/2014 1654   TRIG 377 (H) 09/02/2014 1654   HDL 27 (L) 09/02/2014 1654   CHOLHDL 6.8 09/02/2014 1654   VLDL 75 (H) 09/02/2014 1654   LDLCALC 82 09/02/2014 1654    CBC    Component Value Date/Time   WBC 8.1 09/02/2014 1654   RBC 5.01 09/02/2014 1654   HGB 14.8 09/02/2014 1654   HCT 42.9 09/02/2014 1654   PLT 262 09/02/2014 1654   MCV 85.6 09/02/2014 1654   MCH 29.5 09/02/2014 1654   MCHC 34.5 09/02/2014 1654   RDW 12.9 09/02/2014 1654   LYMPHSABS 2.6 09/02/2014 1654   MONOABS 0.6 09/02/2014 1654   EOSABS 0.1 09/02/2014 1654   BASOSABS 0.0 09/02/2014 1654    Hgb A1C No results found for: HGBA1C         Assessment & Plan:   Preventative Health Maintenance:  Encouraged him to get a flu shot in the fall Tetanus UTD Encouraged him to consume a balanced diet and start an exercise regimen Advised him to see a dentist annually Will check CBC, CMET, Lipid and HIV today  Right Inguinal Hernia:  Referral placed to gen surgery to discuss surgical treatment  RTC in 1 year, sooner if needed [name], NP

## 2016-02-07 LAB — LIPID PANEL
CHOLESTEROL: 200 mg/dL (ref 0–200)
HDL: 35.5 mg/dL — ABNORMAL LOW (ref >39.00)
Total CHOL/HDL Ratio: 6
Triglycerides: 491 mg/dL — ABNORMAL HIGH (ref 0.0–149.0)

## 2016-02-07 LAB — CBC
HCT: 41.4 % (ref 39.0–52.0)
Hemoglobin: 14.3 g/dL (ref 13.0–17.0)
MCHC: 34.5 g/dL (ref 30.0–36.0)
MCV: 86.4 fl (ref 78.0–100.0)
Platelets: 245 10*3/uL (ref 150.0–400.0)
RBC: 4.79 Mil/uL (ref 4.22–5.81)
RDW: 12.3 % (ref 11.5–15.5)
WBC: 9.1 10*3/uL (ref 4.0–10.5)

## 2016-02-07 LAB — COMPREHENSIVE METABOLIC PANEL
ALBUMIN: 4.5 g/dL (ref 3.5–5.2)
ALT: 39 U/L (ref 0–53)
AST: 24 U/L (ref 0–37)
Alkaline Phosphatase: 68 U/L (ref 39–117)
BUN: 19 mg/dL (ref 6–23)
CHLORIDE: 104 meq/L (ref 96–112)
CO2: 26 mEq/L (ref 19–32)
CREATININE: 1.02 mg/dL (ref 0.40–1.50)
Calcium: 9.9 mg/dL (ref 8.4–10.5)
GFR: 92.39 mL/min (ref >60.00)
GLUCOSE: 82 mg/dL (ref 70–99)
Potassium: 4.2 mEq/L (ref 3.5–5.1)
SODIUM: 139 meq/L (ref 135–145)
Total Bilirubin: 0.9 mg/dL (ref 0.2–1.2)
Total Protein: 7.7 g/dL (ref 6.0–8.3)

## 2016-02-07 LAB — LDL CHOLESTEROL, DIRECT: LDL DIRECT: 123 mg/dL

## 2016-02-07 LAB — HIV ANTIBODY (ROUTINE TESTING W REFLEX): HIV: NONREACTIVE

## 2016-02-21 ENCOUNTER — Other Ambulatory Visit: Payer: Self-pay

## 2016-02-21 DIAGNOSIS — F988 Other specified behavioral and emotional disorders with onset usually occurring in childhood and adolescence: Secondary | ICD-10-CM

## 2016-02-21 MED ORDER — AMPHETAMINE-DEXTROAMPHET ER 30 MG PO CP24: 30.0000 mg | ORAL_CAPSULE | ORAL | 0 refills | Status: DC

## 2016-02-21 NOTE — Telephone Encounter (Signed)
RX printed and signed and placed in MYD box 

## 2016-02-21 NOTE — Telephone Encounter (Signed)
Pt left v/m requesting rx for Adderall. Call when ready for pick up. rx last printed # 30 on 01/16/16; last seen 02/06/16.

## 2016-02-22 NOTE — Telephone Encounter (Signed)
Rx left in front office for pick up and pt is aware  

## 2016-04-11 ENCOUNTER — Telehealth: Payer: Self-pay

## 2016-04-11 ENCOUNTER — Other Ambulatory Visit: Payer: Self-pay

## 2016-04-11 DIAGNOSIS — F988 Other specified behavioral and emotional disorders with onset usually occurring in childhood and adolescence: Secondary | ICD-10-CM

## 2016-04-11 MED ORDER — AMPHETAMINE-DEXTROAMPHET ER 30 MG PO CP24: 30.0000 mg | ORAL_CAPSULE | ORAL | 0 refills | Status: DC

## 2016-04-11 MED ORDER — AMPHETAMINE-DEXTROAMPHETAMINE 10 MG PO TABS: 10.0000 mg | ORAL_TABLET | Freq: Every day | ORAL | 0 refills | Status: DC | PRN

## 2016-04-11 NOTE — Telephone Encounter (Signed)
Spoke to pt and he states that there are sometimes he gets up later or almost forgets to take medication and it is 11am-12pm by the time he takes med. By the time evening comes, he reports he feels weird... Pt wants to know if he can have a few regular strength Adderall for the 2-3 days per week he may need to take later but extended release is too much---please advise if okay to refill 30mg XR and give a small quantity to cover those days he does not need to take early

## 2016-04-11 NOTE — Telephone Encounter (Signed)
Is he saying that he wants to switch to tabs so that he can break them in half? This is an extending release medication, it can not be broken in half. Ok to refill Adderall as is.

## 2016-04-11 NOTE — Telephone Encounter (Signed)
Pt wants to know if adderall rx comes in tab form rather than caps. If pt forgets to take med early morning and takes around 11AM pt is too focused at end of day. If there is no tab will need refill of caps. Last printed # 30 on 02/21/16. Last annual 02/06/16. Pt request cb.

## 2016-04-11 NOTE — Addendum Note (Signed)
Addended by: Roena MaladyEVONTENNO, Kory Rains Y on: 04/11/2016 04:46 PM   Modules accepted: Orders

## 2016-04-11 NOTE — Telephone Encounter (Signed)
Rx left in front office for pick up and pt is aware  

## 2016-04-11 NOTE — Telephone Encounter (Signed)
That is fine. Refill Adderall 30 mg XR  New Rx for Adderall 10 mg PO daily prn #15, ) refills

## 2016-04-18 ENCOUNTER — Telehealth: Payer: Self-pay

## 2016-04-18 ENCOUNTER — Encounter
Admission: RE | Admit: 2016-04-18 | Discharge: 2016-04-18 | Disposition: A | Discharge status: Home / Self Care | Payer: 59 | Source: Ambulatory Visit | Attending: Surgery | Admitting: Surgery

## 2016-04-18 HISTORY — DX: Family history of other specified conditions: Z84.89

## 2016-04-18 HISTORY — DX: Cardiac arrhythmia, unspecified: I49.9

## 2016-04-18 HISTORY — DX: Gastro-esophageal reflux disease without esophagitis: K21.9

## 2016-04-18 NOTE — Patient Instructions (Signed)
  Your procedure is scheduled on: 04-25-16  Report to Same Day Surgery 2nd floor medical mall To find out your arrival time please call [phone] between 1PM - 3PM on 04-24-16  Remember: Instructions that are not followed completely may result in serious medical risk, up to and including death, or upon the discretion of your surgeon and anesthesiologist your surgery may need to be rescheduled.    _x___ 1. Do not eat food or drink liquids after midnight. No gum chewing or hard candies.     __x__ 2. No Alcohol for 24 hours before or after surgery.   __x__3. No Smoking for 24 prior to surgery.   ____  4. Bring all medications with you on the day of surgery if instructed.    __x__ 5. Notify your doctor if there is any change in your medical condition     (cold, fever, infections).     Do not wear jewelry, make-up, hairpins, clips or nail polish.  Do not wear lotions, powders, or perfumes. You may wear deodorant.  Do not shave 48 hours prior to surgery. Men may shave face and neck.  Do not bring valuables to the hospital.    [facility] is not responsible for any belongings or valuables.               Contacts, dentures or bridgework may not be worn into surgery.  Leave your suitcase in the car. After surgery it may be brought to your room.  For patients admitted to the hospital, discharge time is determined by your treatment team.   Patients discharged the day of surgery will not be allowed to drive home.    Please read over the following fact sheets that you were given:   [facility] Preparing for Surgery and or MRSA Information   ____ Take these medicines the morning of surgery with A SIP OF WATER:    1. NONE  2.  3.  4.  5.  6.  ____Fleets enema or Magnesium Citrate as directed.   ____ Use CHG Soap or sage wipes as directed on instruction sheet   ____ Use inhalers on the day of surgery and bring to hospital day of surgery  ____ Stop metformin 2 days prior to  surgery    ____ Take 1/2 of usual insulin dose the night before surgery and none on the morning of  surgery.   ____ Stop aspirin or coumadin, or plavix  x__ Stop Anti-inflammatories such as Advil, Aleve, Ibuprofen, Motrin, Naproxen,          Naprosyn, Goodies powders or aspirin products. Ok to take Tylenol.   ____ Stop supplements until after surgery.    ____ Bring C-Pap to the hospital.

## 2016-04-18 NOTE — Telephone Encounter (Signed)
PA submitted through covermymeds [facility]

## 2016-04-18 NOTE — Telephone Encounter (Signed)
PLEASE NOTE: All timestamps contained within this report are represented as [place] Standard Time. CONFIDENTIALTY NOTICE: This fax transmission is intended only for the addressee. It contains information that is legally privileged, confidential or otherwise protected from use or disclosure. If you are not the intended recipient, you are strictly prohibited from reviewing, disclosing, copying using or disseminating any of this information or taking any action in reliance on or regarding this information. If you have received this fax in error, please notify us immediately by telephone so that we can arrange for its return to us. Phone: [phone], Toll-Free: [phone], Fax: [phone] Page: 1 of 1 Call Id: [id number] [facility] - Client TELEPHONE ADVICE RECORD [facility] Patient Name: Edward Bentley Y Gender: Male DOB: February 11, 1986 Age: [age] Y 2 M 29 D Return Phone Number: [phone] (Primary) Address: City/State/Zip: [place] Client [facility] - Night Physician Nicki ReaperBaity, Regina - NP Contact Type Call Who Is Calling Patient / Member / Family / Caregiver Call Type Triage / Clinical Relationship To Patient Self Return Phone Number [phone] (Primary) Chief Complaint Prescription Refill or Medication Request (non symptomatic) Reason for Call Symptomatic / Request for Health Information Initial Comment Caller States his medication needs approval Translation No Nurse Assessment Guidelines Guideline Title Affirmed Question Affirmed Notes Nurse Date/Time (Eastern Time) Disp. Time [name](Eastern Time) Disposition Final User 04/18/2016 7:25:20 AM Attempt made - message left Henard, RN, Melene PlanKaci 04/18/2016 7:51:28 AM FINAL ATTEMPT MADE - message left Yes Henard, RN, Melene PlanKaci

## 2016-04-18 NOTE — Telephone Encounter (Signed)
I spoke with pt and he could not pick up his Adderall.spoke with [facility] and they will refax PA request. Pt request cb when completed.

## 2016-04-19 MED ORDER — ADDERALL XR 30 MG PO CP24: 30.0000 mg | ORAL_CAPSULE | Freq: Every day | ORAL | 0 refills | Status: DC

## 2016-04-19 NOTE — Telephone Encounter (Signed)
Left detailed msg on VM per HIPAA Received fax this morning stating that a PA was not required

## 2016-04-19 NOTE — Telephone Encounter (Signed)
I called [facility] and the pharmacist stated that the Adderall 30mg  has to be name brand to be covered and the PA needs to be completed for the 10mg ---Left detailed msg on VM per HIPAA letting pt know that he should be able to pick up the 30mg  and in the meantime I will complete the PA for the 10mg  that he takes PRN so there will be no delay in the refill in the future PA will be complete via covermymeds through [facility]

## 2016-04-25 ENCOUNTER — Ambulatory Visit: Payer: Commercial Managed Care - HMO | Admitting: Registered Nurse

## 2016-04-25 ENCOUNTER — Ambulatory Visit
Admission: RE | Admit: 2016-04-25 | Discharge: 2016-04-25 | Disposition: A | Discharge status: Home / Self Care | Payer: Commercial Managed Care - HMO | Source: Ambulatory Visit | Attending: Surgery | Admitting: Surgery

## 2016-04-25 ENCOUNTER — Encounter
Admission: RE | Disposition: A | Discharge status: Home / Self Care | Payer: Self-pay | Source: Ambulatory Visit | Attending: Surgery

## 2016-04-25 DIAGNOSIS — K4090 Unilateral inguinal hernia, without obstruction or gangrene, not specified as recurrent: Secondary | ICD-10-CM | POA: Insufficient documentation

## 2016-04-25 DIAGNOSIS — K219 Gastro-esophageal reflux disease without esophagitis: Secondary | ICD-10-CM | POA: Diagnosis not present

## 2016-04-25 HISTORY — PX: INGUINAL HERNIA REPAIR: SHX194

## 2016-04-25 SURGERY — REPAIR, HERNIA, INGUINAL, ADULT
Anesthesia: General | Site: Inguinal | Laterality: Right | Wound class: Clean Contaminated

## 2016-04-25 MED ORDER — DEXAMETHASONE SODIUM PHOSPHATE 10 MG/ML IJ SOLN
INTRAMUSCULAR | Status: DC | PRN
  Administered 2016-04-25: 4 mg via INTRAVENOUS

## 2016-04-25 MED ORDER — CEFAZOLIN SODIUM-DEXTROSE 2-4 GM/100ML-% IV SOLN
2.0000 g | Freq: Once | INTRAVENOUS | Status: AC
  Administered 2016-04-25: 2 g via INTRAVENOUS

## 2016-04-25 MED ORDER — FAMOTIDINE 20 MG PO TABS
20.0000 mg | ORAL_TABLET | Freq: Once | ORAL | Status: AC
  Administered 2016-04-25: 20 mg via ORAL

## 2016-04-25 MED ORDER — LIDOCAINE HCL (CARDIAC) 20 MG/ML IV SOLN
INTRAVENOUS | Status: DC | PRN
  Administered 2016-04-25: 80 mg via INTRAVENOUS

## 2016-04-25 MED ORDER — FENTANYL CITRATE (PF) 100 MCG/2ML IJ SOLN
INTRAMUSCULAR | Status: AC
  Administered 2016-04-25: 25 ug via INTRAVENOUS
  Filled 2016-04-25: qty 2

## 2016-04-25 MED ORDER — ONDANSETRON HCL 4 MG/2ML IJ SOLN
INTRAMUSCULAR | Status: DC | PRN
  Administered 2016-04-25: 4 mg via INTRAVENOUS

## 2016-04-25 MED ORDER — FENTANYL CITRATE (PF) 100 MCG/2ML IJ SOLN
INTRAMUSCULAR | Status: DC | PRN
  Administered 2016-04-25 (×3): 50 ug via INTRAVENOUS

## 2016-04-25 MED ORDER — HYDROCODONE-ACETAMINOPHEN 5-325 MG PO TABS
1.0000 | ORAL_TABLET | ORAL | Status: DC | PRN
  Administered 2016-04-25: 1 via ORAL

## 2016-04-25 MED ORDER — BUPIVACAINE-EPINEPHRINE (PF) 0.5% -1:200000 IJ SOLN
INTRAMUSCULAR | Status: DC | PRN
  Administered 2016-04-25: 20 mL

## 2016-04-25 MED ORDER — HYDROCODONE-ACETAMINOPHEN 5-325 MG PO TABS
ORAL_TABLET | ORAL | Status: AC
  Filled 2016-04-25: qty 1

## 2016-04-25 MED ORDER — BUPIVACAINE-EPINEPHRINE (PF) 0.5% -1:200000 IJ SOLN
INTRAMUSCULAR | Status: AC
Start: 2016-04-25 — End: 2016-04-25
  Filled 2016-04-25: qty 30

## 2016-04-25 MED ORDER — MIDAZOLAM HCL 2 MG/2ML IJ SOLN
INTRAMUSCULAR | Status: DC | PRN
  Administered 2016-04-25: 2 mg via INTRAVENOUS

## 2016-04-25 MED ORDER — CLOTRIMAZOLE 1 % EX CREA
TOPICAL_CREAM | Freq: Two times a day (BID) | CUTANEOUS | Status: DC
  Filled 2016-04-25: qty 15

## 2016-04-25 MED ORDER — LACTATED RINGERS IV SOLN
INTRAVENOUS | Status: DC
  Administered 2016-04-25: 06:00:00 via INTRAVENOUS

## 2016-04-25 MED ORDER — SUGAMMADEX SODIUM 200 MG/2ML IV SOLN
INTRAVENOUS | Status: DC | PRN
  Administered 2016-04-25: 200 mg via INTRAVENOUS

## 2016-04-25 MED ORDER — PROPOFOL 10 MG/ML IV BOLUS
INTRAVENOUS | Status: DC | PRN
  Administered 2016-04-25: 50 mg via INTRAVENOUS
  Administered 2016-04-25: 200 mg via INTRAVENOUS

## 2016-04-25 MED ORDER — FENTANYL CITRATE (PF) 100 MCG/2ML IJ SOLN
25.0000 ug | INTRAMUSCULAR | Status: DC | PRN
  Administered 2016-04-25 (×2): 25 ug via INTRAVENOUS

## 2016-04-25 MED ORDER — ROCURONIUM BROMIDE 100 MG/10ML IV SOLN
INTRAVENOUS | Status: DC | PRN
  Administered 2016-04-25: 10 mg via INTRAVENOUS
  Administered 2016-04-25: 40 mg via INTRAVENOUS

## 2016-04-25 MED ORDER — HYDROCODONE-ACETAMINOPHEN 5-325 MG PO TABS: 1.0000 | ORAL_TABLET | ORAL | 0 refills | Status: DC | PRN

## 2016-04-25 MED ORDER — ONDANSETRON HCL 4 MG/2ML IJ SOLN: 4.0000 mg | Freq: Once | INTRAMUSCULAR | Status: DC | PRN

## 2016-04-25 SURGICAL SUPPLY — 29 items
BLADE CLIPPER SURG (BLADE) ×2 IMPLANT
BLADE SURG 15 STRL LF DISP TIS (BLADE) ×1 IMPLANT
BLADE SURG 15 STRL SS (BLADE) ×1
CANISTER SUCT 1200ML W/VALVE (MISCELLANEOUS) ×2 IMPLANT
CHLORAPREP W/TINT 26ML (MISCELLANEOUS) ×2 IMPLANT
DERMABOND ADVANCED (GAUZE/BANDAGES/DRESSINGS) ×1
DERMABOND ADVANCED .7 DNX12 (GAUZE/BANDAGES/DRESSINGS) ×1 IMPLANT
DRAIN PENROSE 5/8X18 LTX STRL (WOUND CARE) ×2 IMPLANT
DRAPE LAPAROTOMY 77X122 PED (DRAPES) ×2 IMPLANT
ELECT REM PT RETURN 9FT ADLT (ELECTROSURGICAL) ×2
ELECTRODE REM PT RTRN 9FT ADLT (ELECTROSURGICAL) ×1 IMPLANT
GLOVE BIO SURGEON STRL SZ7.5 (GLOVE) ×8 IMPLANT
GLOVE INDICATOR 8.0 STRL GRN (GLOVE) ×2 IMPLANT
GOWN STRL REUS W/ TWL LRG LVL3 (GOWN DISPOSABLE) ×3 IMPLANT
GOWN STRL REUS W/TWL LRG LVL3 (GOWN DISPOSABLE) ×3
KIT RM TURNOVER STRD PROC AR (KITS) ×2 IMPLANT
LABEL OR SOLS (LABEL) ×2 IMPLANT
LIQUID BAND (GAUZE/BANDAGES/DRESSINGS) IMPLANT
MESH SYNTHETIC 4X6 SOFT BARD (Mesh General) ×1 IMPLANT
MESH SYNTHETIC SOFT BARD 4X6 (Mesh General) ×1 IMPLANT
NEEDLE HYPO 25X1 1.5 SAFETY (NEEDLE) ×2 IMPLANT
NS IRRIG 500ML POUR BTL (IV SOLUTION) ×2 IMPLANT
PACK BASIN MINOR ARMC (MISCELLANEOUS) ×2 IMPLANT
SUT CHROMIC 4 0 RB 1X27 (SUTURE) IMPLANT
SUT MNCRL AB 4-0 PS2 18 (SUTURE) ×2 IMPLANT
SUT SURGILON 0 30 BLK (SUTURE) ×8 IMPLANT
SUT VIC AB 4-0 SH 27 (SUTURE) ×1
SUT VIC AB 4-0 SH 27XANBCTRL (SUTURE) ×1 IMPLANT
SYRINGE 10CC LL (SYRINGE) ×2 IMPLANT

## 2016-04-25 NOTE — Anesthesia Postprocedure Evaluation (Signed)
Anesthesia Post Note  Patient: Edward Bentley  Procedure(s) Performed: Procedure(s) (LRB): HERNIA REPAIR INGUINAL ADULT (Right)  Patient location during evaluation: PACU Anesthesia Type: General Level of consciousness: awake and alert and oriented Pain management: pain level controlled Vital Signs Assessment: post-procedure vital signs reviewed and stable Respiratory status: spontaneous breathing Cardiovascular status: blood pressure returned to baseline Anesthetic complications: no    Last Vitals:  Vitals:   04/25/16 1015 04/25/16 1202  BP: 124/73 (!) 145/95  Pulse: 66 72  Resp: 16 16  Temp: 36.1 C     Last Pain:  Vitals:   04/25/16 1202  TempSrc:   PainSc: 6                  Inri Sobieski

## 2016-04-25 NOTE — Discharge Instructions (Addendum)
AMBULATORY SURGERY  DISCHARGE INSTRUCTIONS   1) The drugs that you were given will stay in your system until tomorrow so for the next 24 hours you should not:  A) Drive an automobile B) Make any legal decisions C) Drink any alcoholic beverage   2) You may resume regular meals tomorrow.  Today it is better to start with liquids and gradually work up to solid foods.  You may eat anything you prefer, but it is better to start with liquids, then soup and crackers, and gradually work up to solid foods.   3) Please notify your doctor immediately if you have any unusual bleeding, trouble breathing, redness and pain at the surgery site, drainage, fever, or pain not relieved by medication.    4) Additional Instructions:        Please contact your physician with any problems or Same Day Surgery at [phone], Monday through Friday 6 am to 4 pm, or [place] [facility] Main number at [phone].  Take Tylenol or Norco if needed for pain.  Should not drive or do anything dangerous when taking Norco.  Purchase clotrimazole cream over-the-counter and apply to groin rash 2 times per day for 10 days.  May shower.  Avoid straining and heavy lifting.

## 2016-04-25 NOTE — H&P (Signed)
Edward Bentley is an 30 y.o. male.   Chief Complaint: Bulging in the right groin HPI: He reports recent bulging in the right groin with moderate discomfort. A right inguinal hernia was demonstrated on physical exam and repair is recommended for definitive treatment.  Past Medical History:  Diagnosis Date  . ADD (attention deficit disorder)   . Dysrhythmia    H/O IRREGULAR HEART BEAT IN HIGH SCHOOL-NO PROBLEMS SINCE  . Family history of adverse reaction to anesthesia    PTS DAD WOKE UP DURING A SURGERY ONCE  . GERD (gastroesophageal reflux disease)    NO MEDS    Past Surgical History:  Procedure Laterality Date  . NO PAST SURGERIES    . WISDOM TOOTH EXTRACTION      Family History  Problem Relation Age of Onset  . Hypertension Mother   . Hypertension Father   . Heart disease Maternal Grandfather   . Alcohol abuse Paternal Grandfather   . Cancer Neg Hx    Social History:  reports that he has never smoked. He has never used smokeless tobacco. He reports that he drinks alcohol. He reports that he does not use drugs.  Allergies: No Known Allergies  Medications Prior to Admission  Medication Sig Dispense Refill  . ADDERALL XR 30 MG 24 hr capsule Take 1 capsule (30 mg total) by mouth daily.  0  . amphetamine-dextroamphetamine (ADDERALL) 10 MG tablet Take 1 tablet (10 mg total) by mouth daily as needed. 15 tablet 0    No results found for this or any previous visit (from the past 48 hour(s)). No results found.  ROS he reports no recent acute illness such as cough cold or sore throat. He has developed a rash in the groin.  Blood pressure 136/80, pulse 85, temperature (!) 96 F (35.6 C), temperature source Tympanic, resp. rate 18, height 5\' 10"  (1.778 m), weight 99.8 kg (220 lb), SpO2 98 %. Physical Exam  GENERAL:  Awake alert and oriented and in no acute distress.  HEENT:  Head is normocephalic.  Pupils are equal reactive to light.  Extraocular movements are intact. Sclera  is clear.  Pharynx is clear.  NECK:  Supple with no palpable mass and no adenopathy.  LUNGS:  Clear without rales rhonchi or wheezes.  HEART:  Regular rhythm S1-S2, without murmur.  ABDOMEN: The hernia in the right groin is currently reduced. The right side was marked YES   NEUROLOGIC: Awake alert and oriented and moving all extremities.  EXTREMITIES: No dependent edema  Assessment/Plan Right inguinal hernia. I discussed the plan for right inguinal hernia repair  Renda RollsWilton Briston Lax, MD 04/25/2016, 7:08 AM

## 2016-04-25 NOTE — Transfer of Care (Signed)
Immediate Anesthesia Transfer of Care Note  Patient: Edward Bentley  Procedure(s) Performed: Procedure(s): HERNIA REPAIR INGUINAL ADULT (Right)  Patient Location: PACU  Anesthesia Type:General  Level of Consciousness: awake  Airway & Oxygen Therapy: Patient Spontanous Breathing  Post-op Assessment: Report given to RN  Post vital signs: stable  Last Vitals:  Vitals:   04/25/16 0919 04/25/16 0921  BP: 120/70 120/70  Pulse: 80 81  Resp: 18 12  Temp: 36.3 C 36.3 C    Last Pain:  Vitals:   04/25/16 0921  TempSrc: Temporal         Complications: No apparent anesthesia complications

## 2016-04-25 NOTE — Anesthesia Procedure Notes (Signed)
Procedure Name: Intubation Date/Time: 04/25/2016 7:38 AM Performed by: Karoline CaldwellSTARR, Daley Mooradian Pre-anesthesia Checklist: Patient identified, Emergency Drugs available, Suction available, Patient being monitored and Timeout performed Patient Re-evaluated:Patient Re-evaluated prior to inductionOxygen Delivery Method: Circle system utilized Preoxygenation: Pre-oxygenation with 100% oxygen Intubation Type: IV induction Ventilation: Mask ventilation without difficulty Laryngoscope Size: Mac and 4 Grade View: Grade I Tube type: Oral Tube size: 7.5 mm Number of attempts: 1 Airway Equipment and Method: Stylet Placement Confirmation: ETT inserted through vocal cords under direct vision,  positive ETCO2,  CO2 detector and breath sounds checked- equal and bilateral Secured at: 23 cm Tube secured with: Tape Dental Injury: Teeth and Oropharynx as per pre-operative assessment
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
## 2016-04-25 NOTE — Op Note (Signed)
OPERATIVE REPORT  PREOPERATIVE DIAGNOSIS: right inguinal hernia  POSTOPERATIVE DIAGNOSIS:right  inguinal hernia  PROCEDURE:  right inguinal hernia repair  ANESTHESIA:  General  SURGEON:  Renda RollsWilton Nixie Laube M.D.  INDICATIONS: He reports bulging in the right groin. He had recent physical findings of right inguinal hernia and repair was recommended for definitive treatment.  With the patient on the operating table in the supine position the right lower quadrant was prepared with clippers and with ChloraPrep and draped in a sterile manner. A transversely oriented suprapubic incision was made and carried down through subcutaneous tissues. Electrocautery was used for hemostasis. The Scarpa's fascia was incised. The external oblique aponeurosis was incised along the course of its fibers to open the external ring and expose the inguinal cord structures. The cord structures were mobilized. A Penrose drain was passed around the cord structures for traction. There was a finding of a direct inguinal hernia. Cremaster fibers were separated to examine the cord structures and found no indirect component. The direct inguinal hernia had a large defect extending from the internal ring to the pubic tubercle. The sac was inverted. The repair was carried out with 0 Surgilon sutures suturing the conjoined tendon to the shelving edge of the inguinal ligament incorporating transversalis fascia into the repair. The last stitch led to satisfactory narrowing of the internal ring. A relaxing incision was made medially. Bard soft mesh was cut to create an oval shape and was placed over the repair and over the relaxing incision. This was sutured to the repair with interrupted 0 Surgilon sutures and also sutured medially to the deep fascia and on both sides of the internal ring. It was also sutured to the medial border of the relaxing incision and the lateral border. Next after seeing hemostasis was intact the cord structures were  replaced along the floor of the inguinal canal. The cut edges of the external oblique aponeurosis were closed with a running 4-0 Vicryl suture to re-create the external ring. The deep fascia superior and lateral to the repair site was infiltrated with half percent Sensorcaine with epinephrine. Subcutaneous tissues were also infiltrated. The Scarpa's fascia was closed with interrupted 4-0 Vicryl sutures. The skin was closed with running 4-0 Monocryl subcuticular suture and LiquiBand. The testicle remained in the scrotum  The patient appeared to be in satisfactory condition and was prepared for transfer to the recovery room.  Renda RollsWilton Autrey Human M.D.

## 2016-04-25 NOTE — OR Nursing (Signed)
1155 Dr. Katrinka BlazingSmith visited and examine the scrotal area and incisional area.  Instructed on use of cream, cool compresses/ice and cool shower over crotch area. OK'd dc home

## 2016-04-27 ENCOUNTER — Telehealth: Payer: Self-pay | Admitting: General Surgery

## 2016-04-28 NOTE — Telephone Encounter (Signed)
Reported numbness on left anterior thigh. No motor deficit. S/P inguinal hernia repair 48 hours earlier. To contact primary surgeon on Oct 22 if symptoms persist.

## 2016-04-28 NOTE — Telephone Encounter (Signed)
x

## 2016-04-30 ENCOUNTER — Telehealth: Payer: Self-pay

## 2016-04-30 NOTE — Telephone Encounter (Signed)
Patient called to verify status of his prior authorization on his Adderall r/x's. I was able to locate in chart where [name], CMA received approval earlier today and was able to confirm this with [facility].  Patient made aware.

## 2016-04-30 NOTE — Telephone Encounter (Signed)
Adderall 10mg  PA has been submitted and is approved through 04/2017---must be generic for 10mg  and name brand only for 30mg  XR

## 2016-05-06 ENCOUNTER — Encounter: Payer: Self-pay | Admitting: Surgery

## 2016-05-24 ENCOUNTER — Other Ambulatory Visit: Payer: Self-pay

## 2016-05-24 MED ORDER — AMPHETAMINE-DEXTROAMPHETAMINE 10 MG PO TABS: 10.0000 mg | ORAL_TABLET | Freq: Every day | ORAL | 0 refills | Status: DC | PRN

## 2016-05-24 MED ORDER — ADDERALL XR 30 MG PO CP24: 30.0000 mg | ORAL_CAPSULE | Freq: Every day | ORAL | 0 refills | Status: DC

## 2016-05-24 NOTE — Telephone Encounter (Signed)
Rx left in front office for pick up and pt is aware  

## 2016-05-24 NOTE — Telephone Encounter (Signed)
Pt left v/m requesting rx for Adderall XR 30 mg (last printed # 30 on 04/11/16) and Adderall 10 mg(last printed # 15 on 04/11/16. Last seen 02/06/16. Call when ready for pick up.

## 2016-05-24 NOTE — Telephone Encounter (Signed)
RX printed and signed and placed in MYD box 

## 2016-06-08 ENCOUNTER — Telehealth: Payer: Self-pay

## 2016-06-08 NOTE — Telephone Encounter (Signed)
I received a fax requesting PA to be submitted for pt's Adderall XR, I had been told previously from the pharmacist as noted in phone note from 04/2016---he stated the Adderall XR had to be name brand in order for insurance to pay..theophylline last Rx from 05/24/16 was for name brand---PA has been submitted will wait for response

## 2016-07-02 ENCOUNTER — Other Ambulatory Visit: Payer: Self-pay

## 2016-07-02 NOTE — Telephone Encounter (Addendum)
Pt left message on Triage line asking for refills on both Adderall doses. Last written 05-24-16 Last OV 02-06-16

## 2016-07-02 NOTE — Addendum Note (Signed)
Addended by: Eual FinesBRIDGES, SHANNON P on: 07/02/2016 03:48 PM   Modules accepted: Orders

## 2016-07-03 MED ORDER — ADDERALL XR 30 MG PO CP24: 30.0000 mg | ORAL_CAPSULE | Freq: Every day | ORAL | 0 refills | Status: DC

## 2016-07-03 MED ORDER — AMPHETAMINE-DEXTROAMPHETAMINE 10 MG PO TABS: 10.0000 mg | ORAL_TABLET | Freq: Every day | ORAL | 0 refills | Status: DC | PRN

## 2016-07-03 NOTE — Telephone Encounter (Signed)
RX printed and signed and placed in MYD box 

## 2016-07-03 NOTE — Telephone Encounter (Signed)
Lm on pts vm and advised Rx is available for pickup from the front desk. Pt advised third party unable to pickup 

## 2016-08-28 ENCOUNTER — Encounter: Payer: Self-pay | Admitting: Surgery

## 2016-09-02 ENCOUNTER — Other Ambulatory Visit: Payer: Self-pay

## 2016-09-02 MED ORDER — AMPHETAMINE-DEXTROAMPHETAMINE 10 MG PO TABS
10.0000 mg | ORAL_TABLET | Freq: Every day | ORAL | 0 refills | Status: DC | PRN
Start: 2016-09-02 — End: 2016-10-10

## 2016-09-02 MED ORDER — ADDERALL XR 30 MG PO CP24: 30.0000 mg | ORAL_CAPSULE | Freq: Every day | ORAL | 0 refills | Status: DC

## 2016-09-02 NOTE — Telephone Encounter (Signed)
Left detailed msg on VM per HIPAA Will place Rx in front office for pick up

## 2016-09-02 NOTE — Telephone Encounter (Signed)
Pt left v/m requesting rx for Adderall XR 30 mg (last printed # 30 on 07/03/16) and Adderall 10 mg(last printed # 15 on 07/03/16). Last annual 02/06/16. Call when ready for pick up.

## 2016-09-02 NOTE — Telephone Encounter (Signed)
RX printed and signed and placed in MYD box 

## 2016-10-09 ENCOUNTER — Telehealth: Payer: Self-pay

## 2016-10-09 NOTE — Telephone Encounter (Signed)
Pt wants to decrease adderall rx. Pt wants to take adderall 30 mg immediate release; sometimes pt forgets to take the 30 mg XR(last printed # 30 on 09/02/16) and if takes it after lunch pt does not like how it makes him feel later in the day. Also if pt just gets one rx instead of 2 rx will be more affordable for pt.pt last seen annual 02/06/16. Pt request cb on 10/10/16.

## 2016-10-10 MED ORDER — AMPHETAMINE-DEXTROAMPHETAMINE 30 MG PO TABS: 30.0000 mg | ORAL_TABLET | Freq: Every day | ORAL | 0 refills | Status: DC

## 2016-10-10 NOTE — Telephone Encounter (Signed)
Can you call to clarify. Is he wanting to take Adderall IR just 1 x day? And does he need a refill now?

## 2016-10-10 NOTE — Telephone Encounter (Signed)
Spoke to pt and informed him Rx is available for pickup from the front desk. Pt advised third party unable to pickup .  

## 2016-10-10 NOTE — Addendum Note (Signed)
Addended by: Lorre Munroe on: 10/10/2016 10:08 AM   Modules accepted: Orders

## 2016-10-10 NOTE — Telephone Encounter (Signed)
Spoke to pt who states he is wanting to take  immediate release, and if he forgets, he can take 1/2 in the pm. He is wanting to d/c  and states he is needing a refill

## 2016-10-10 NOTE — Telephone Encounter (Signed)
RX printed and signed and placed in MYD box 

## 2016-10-23 ENCOUNTER — Encounter: Payer: Self-pay | Admitting: Internal Medicine

## 2016-10-24 MED FILL — AMPHETAMINE SALTS 30 MG TAB: 30 | 30 days supply | Qty: 30 | Fill #0

## 2016-10-25 ENCOUNTER — Encounter: Payer: Self-pay | Admitting: Internal Medicine

## 2016-10-28 ENCOUNTER — Emergency Department (HOSPITAL_COMMUNITY): Payer: 59

## 2016-10-28 ENCOUNTER — Encounter (HOSPITAL_COMMUNITY): Payer: Self-pay | Admitting: *Deleted

## 2016-10-28 ENCOUNTER — Emergency Department (HOSPITAL_COMMUNITY)
Admission: EM | Admit: 2016-10-28 | Discharge: 2016-10-28 | Disposition: A | Discharge status: Home / Self Care | Payer: 59 | Attending: Emergency Medicine | Admitting: Emergency Medicine

## 2016-10-28 DIAGNOSIS — S20219A Contusion of unspecified front wall of thorax, initial encounter: Secondary | ICD-10-CM | POA: Diagnosis not present

## 2016-10-28 DIAGNOSIS — Y939 Activity, unspecified: Secondary | ICD-10-CM | POA: Insufficient documentation

## 2016-10-28 DIAGNOSIS — S199XXA Unspecified injury of neck, initial encounter: Secondary | ICD-10-CM | POA: Diagnosis present

## 2016-10-28 DIAGNOSIS — Y999 Unspecified external cause status: Secondary | ICD-10-CM | POA: Diagnosis not present

## 2016-10-28 DIAGNOSIS — Y92410 Unspecified street and highway as the place of occurrence of the external cause: Secondary | ICD-10-CM | POA: Diagnosis not present

## 2016-10-28 DIAGNOSIS — S161XXA Strain of muscle, fascia and tendon at neck level, initial encounter: Secondary | ICD-10-CM | POA: Insufficient documentation

## 2016-10-28 MED ORDER — KETOROLAC TROMETHAMINE 60 MG/2ML IM SOLN
60.0000 mg | Freq: Once | INTRAMUSCULAR | Status: AC
  Administered 2016-10-28: 60 mg via INTRAMUSCULAR
  Filled 2016-10-28: qty 2

## 2016-10-28 MED ORDER — HYDROCODONE-ACETAMINOPHEN 5-325 MG PO TABS
2.0000 | ORAL_TABLET | Freq: Once | ORAL | Status: AC
  Administered 2016-10-28: 2 via ORAL
  Filled 2016-10-28: qty 2

## 2016-10-28 MED ORDER — HYDROCODONE-ACETAMINOPHEN 5-325 MG PO TABS: 1.0000 | ORAL_TABLET | ORAL | 0 refills | Status: DC | PRN

## 2016-10-28 MED ORDER — ORPHENADRINE CITRATE ER 100 MG PO TB12: 100.0000 mg | ORAL_TABLET | Freq: Two times a day (BID) | ORAL | 0 refills | Status: DC

## 2016-10-28 MED ORDER — IBUPROFEN 800 MG PO TABS: 800.0000 mg | ORAL_TABLET | Freq: Three times a day (TID) | ORAL | 0 refills | Status: DC

## 2016-10-28 NOTE — ED Notes (Signed)
Pt stable, ambulatory, states understanding of discharge instructions 
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
## 2016-10-31 ENCOUNTER — Encounter: Payer: Self-pay | Admitting: Internal Medicine

## 2016-10-31 ENCOUNTER — Ambulatory Visit (INDEPENDENT_AMBULATORY_CARE_PROVIDER_SITE_OTHER): Payer: 59 | Admitting: Internal Medicine

## 2016-10-31 DIAGNOSIS — M542 Cervicalgia: Secondary | ICD-10-CM | POA: Diagnosis not present

## 2016-10-31 DIAGNOSIS — R0789 Other chest pain: Secondary | ICD-10-CM | POA: Diagnosis not present

## 2016-10-31 DIAGNOSIS — M546 Pain in thoracic spine: Secondary | ICD-10-CM | POA: Diagnosis not present

## 2016-10-31 NOTE — Patient Instructions (Signed)
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
## 2016-11-05 ENCOUNTER — Telehealth: Payer: Self-pay

## 2016-11-05 NOTE — Telephone Encounter (Signed)
Pt has appt for tomorrow.

## 2016-11-05 NOTE — Telephone Encounter (Signed)
Left detailed msg on VM per HIPAA  

## 2016-11-05 NOTE — Telephone Encounter (Signed)
He needs to make a follow up appt
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
## 2016-11-06 ENCOUNTER — Encounter: Payer: Self-pay | Admitting: Internal Medicine

## 2016-11-06 ENCOUNTER — Ambulatory Visit (INDEPENDENT_AMBULATORY_CARE_PROVIDER_SITE_OTHER): Payer: 59 | Admitting: Internal Medicine

## 2016-11-06 VITALS — BP 120/68 | HR 78 | Temp 98.0°F | Wt 221.5 lb

## 2016-11-06 DIAGNOSIS — R0789 Other chest pain: Secondary | ICD-10-CM | POA: Diagnosis not present

## 2016-11-06 MED ORDER — PREDNISONE 10 MG PO TABS: ORAL_TABLET | ORAL | 0 refills | Status: DC
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
## 2016-11-06 NOTE — Patient Instructions (Signed)
Chest Wall Pain Chest wall pain is pain in or around the bones and muscles of your chest. Sometimes, an injury causes this pain. Sometimes, the cause may not be known. This pain may take several weeks or longer to get better. Follow these instructions at home: Pay attention to any changes in your symptoms. Take these actions to help with your pain:  Rest as told by your doctor.  Avoid activities that cause pain. Try not to use your chest, belly (abdominal), or side muscles to lift heavy things.  If directed, apply ice to the painful area:  Put ice in a plastic bag.  Place a towel between your skin and the bag.  Leave the ice on for 20 minutes, 2-3 times per day.  Take over-the-counter and prescription medicines only as told by your doctor.  Do not use tobacco products, including cigarettes, chewing tobacco, and e-cigarettes. If you need help quitting, ask your doctor.  Keep all follow-up visits as told by your doctor. This is important. Contact a doctor if:  You have a fever.  Your chest pain gets worse.  You have new symptoms. Get help right away if:  You feel sick to your stomach (nauseous) or you throw up (vomit).  You feel sweaty or light-headed.  You have a cough with phlegm (sputum) or you cough up blood.  You are short of breath. This information is not intended to replace advice given to you by your health care provider. Make sure you discuss any questions you have with your health care provider. Document Released: 12/11/2007 Document Revised: 11/30/2015 Document Reviewed: 09/19/2014 Elsevier Interactive Patient Education  2017 [employer]  

## 2016-11-07 ENCOUNTER — Other Ambulatory Visit: Payer: Self-pay | Admitting: Internal Medicine

## 2016-11-11 ENCOUNTER — Other Ambulatory Visit: Payer: 59

## 2016-11-12 ENCOUNTER — Ambulatory Visit
Admission: RE | Admit: 2016-11-12 | Discharge: 2016-11-12 | Disposition: A | Discharge status: Home / Self Care | Payer: 59 | Source: Ambulatory Visit | Attending: Internal Medicine | Admitting: Internal Medicine

## 2016-11-12 DIAGNOSIS — R0789 Other chest pain: Secondary | ICD-10-CM

## 2016-11-13 ENCOUNTER — Other Ambulatory Visit: Payer: Self-pay | Admitting: Internal Medicine

## 2016-11-13 MED ORDER — TIZANIDINE HCL 2 MG PO CAPS: 2.0000 mg | ORAL_CAPSULE | Freq: Every evening | ORAL | 0 refills | Status: DC | PRN

## 2016-12-03 ENCOUNTER — Other Ambulatory Visit: Payer: Self-pay | Admitting: *Deleted

## 2016-12-03 MED ORDER — AMPHETAMINE-DEXTROAMPHETAMINE 30 MG PO TABS: 30.0000 mg | ORAL_TABLET | Freq: Every day | ORAL | 0 refills | Status: DC

## 2016-12-03 NOTE — Telephone Encounter (Signed)
RX printed and signed and placed in MYD box 

## 2016-12-03 NOTE — Telephone Encounter (Signed)
Patient left a voicemail requesting a refill on Adderall Last refill 10/10/16 #30, last office visit 11/06/16 Call when ready for pickup

## 2016-12-03 NOTE — Telephone Encounter (Signed)
Rx left in front office for pick up and pt is aware  

## 2016-12-10 MED FILL — AMPHETAMINE SALTS 30 MG TAB: 30 | 30 days supply | Qty: 30 | Fill #0

## 2017-01-10 ENCOUNTER — Other Ambulatory Visit: Payer: Self-pay

## 2017-01-10 ENCOUNTER — Telehealth: Payer: Self-pay

## 2017-01-10 MED ORDER — AMPHETAMINE-DEXTROAMPHETAMINE 30 MG PO TABS: 30.0000 mg | ORAL_TABLET | Freq: Every day | ORAL | 0 refills | Status: DC

## 2017-01-10 NOTE — Telephone Encounter (Signed)
Pt notified Rx ready for pickup 

## 2017-01-10 NOTE — Telephone Encounter (Signed)
Pt left v/m requesting rx for Adderall. Call when ready for pick up. Last printed #30 on 12/03/16; last seen 11/06/16. Pamala Hurry Baity NP is out of office and pt request to pick up rx today. I advised since it is the afternoon I will send to a provider in office but can not guarantee rx today. I reviewed the policy with pt for requesting controlled substance. Pt said he can't remember to call until he needs it but he will try to give more time next month.Please advise.

## 2017-01-10 NOTE — Telephone Encounter (Signed)
Pt left v/m requesting refill on his medicine. Pt did not leave name of med requesting and left v/m requesting pt to cb with name of med that needs refill.

## 2017-01-10 NOTE — Telephone Encounter (Signed)
Px printed for pick up in IN box Done in PCP absence

## 2017-01-13 MED FILL — AMPHETAMINE SALTS 30 MG TAB: 30 | 30 days supply | Qty: 30 | Fill #0

## 2017-02-17 MED FILL — HYDROXYZINE PAM 25 MG CAP: 25 | 10 days supply | Qty: 20 | Fill #0

## 2017-02-17 MED FILL — predniSONE 10 MG TABS: 10 | 6 days supply | Qty: 21 | Fill #0

## 2017-02-18 ENCOUNTER — Other Ambulatory Visit: Payer: Self-pay

## 2017-02-18 MED ORDER — AMPHETAMINE-DEXTROAMPHETAMINE 30 MG PO TABS: 30.0000 mg | ORAL_TABLET | Freq: Every day | ORAL | 0 refills | Status: DC

## 2017-02-18 NOTE — Telephone Encounter (Signed)
Left detailed msg on VM per HIPAA Rx left in front office for pick up  

## 2017-02-18 NOTE — Telephone Encounter (Signed)
Pt left v/m requesting rx for Adderall. Call when ready for pick up. Last printed # 30 on 01/10/17; last annual 02/06/16 and last acute 11/06/16; no future appt scheduled.

## 2017-02-18 NOTE — Telephone Encounter (Signed)
This will be his last RX unless he makes an appt for a physical

## 2017-03-07 MED FILL — AMPHETAMINE SALTS 30 MG TAB: 30 | 30 days supply | Qty: 30 | Fill #0

## 2017-03-27 ENCOUNTER — Encounter: Payer: Self-pay | Admitting: Internal Medicine

## 2017-03-27 ENCOUNTER — Ambulatory Visit (INDEPENDENT_AMBULATORY_CARE_PROVIDER_SITE_OTHER): Payer: 59 | Admitting: Internal Medicine

## 2017-03-27 VITALS — BP 120/80 | HR 76 | Temp 97.9°F | Ht 69.75 in | Wt 214.0 lb

## 2017-03-27 DIAGNOSIS — F900 Attention-deficit hyperactivity disorder, predominantly inattentive type: Secondary | ICD-10-CM

## 2017-03-27 DIAGNOSIS — Z0000 Encounter for general adult medical examination without abnormal findings: Secondary | ICD-10-CM

## 2017-03-27 MED ORDER — AMPHETAMINE-DEXTROAMPHETAMINE 30 MG PO TABS: 30.0000 mg | ORAL_TABLET | Freq: Every day | ORAL | 0 refills | Status: DC

## 2017-03-27 NOTE — Assessment & Plan Note (Signed)
Continue Adderall Will get yearly CSA and UDS

## 2017-03-27 NOTE — Patient Instructions (Signed)

## 2017-03-27 NOTE — Progress Notes (Signed)
Subjective:    Patient ID: Edward Bentley, male    DOB: 1986-06-10, 31 y.o.   MRN: [id number]  HPI  Pt presents to the clinic today for his annual exam. He is also due to follow up chronic conditions.  ADD: Controlled on Adderall. He is needing a refill today.  Flu: never Tetanus: 02/2011 Dentist: as needed  Diet: He does eat meat. He consumes fruits and veggies daily. He does eat fried foods. He drinks mostly water and coffee. Exercise: None  Review of Systems      Past Medical History:  Diagnosis Date  . ADD (attention deficit disorder)   . Dysrhythmia    H/O IRREGULAR HEART BEAT IN HIGH SCHOOL-NO PROBLEMS SINCE  . Family history of adverse reaction to anesthesia    PTS DAD WOKE UP DURING A SURGERY ONCE  . GERD (gastroesophageal reflux disease)    NO MEDS    Current Outpatient Prescriptions  Medication Sig Dispense Refill  . amphetamine-dextroamphetamine (ADDERALL) 30 MG tablet Take 1 tablet by mouth daily. 30 tablet 0  . HYDROcodone-acetaminophen (NORCO/VICODIN) 5-325 MG tablet Take 1-2 tablets by mouth every 4 (four) hours as needed for moderate pain or severe pain. 10 tablet 0  . ibuprofen (ADVIL,MOTRIN) 800 MG tablet Take 1 tablet (800 mg total) by mouth 3 (three) times daily. 21 tablet 0  . predniSONE (DELTASONE) 10 MG tablet Take 3 tabs on days 1-3, take 2 tabs on days 4-6, take 1 tab on days 7-9 18 tablet 0  . tizanidine (ZANAFLEX) 2 MG capsule Take 1 capsule (2 mg total) by mouth at bedtime as needed for muscle spasms. 15 capsule 0   No current facility-administered medications for this visit.     No Known Allergies  Family History  Problem Relation Age of Onset  . Hypertension Mother   . Hypertension Father   . Heart disease Maternal Grandfather   . Alcohol abuse Paternal Grandfather   . Cancer Neg Hx     Social History   Social History  . Marital status: Married    Spouse name: N/A  . Number of children: N/A  . Years of education: N/A    Occupational History  . Not on file.   Social History Main Topics  . Smoking status: Never Smoker  . Smokeless tobacco: Never Used  . Alcohol use 0.0 oz/week     Comment: occasional--social  . Drug use: No  . Sexual activity: Yes   Other Topics Concern  . Not on file   Social History Narrative  . No narrative on file     Constitutional: Denies fever, malaise, fatigue, headache or abrupt weight changes.  HEENT: Denies eye pain, eye redness, ear pain, ringing in the ears, wax buildup, runny nose, nasal congestion, bloody nose, or sore throat. Respiratory: Denies difficulty breathing, shortness of breath, cough or sputum production.   Cardiovascular: Denies chest pain, chest tightness, palpitations or swelling in the hands or feet.  Gastrointestinal: Denies abdominal pain, bloating, constipation, diarrhea or blood in the stool.  GU: Denies urgency, frequency, pain with urination, burning sensation, blood in urine, odor or discharge. Musculoskeletal: Denies decrease in range of motion, difficulty with gait, muscle pain or joint pain and swelling.  Skin: Denies redness, rashes, lesions or ulcercations.  Neurological: Denies dizziness, difficulty with memory, difficulty with speech or problems with balance and coordination.  Psych: Denies anxiety, depression, SI/HI.  No other specific complaints in a complete review of systems (except as listed in HPI  above).  Objective:   Physical Exam    BP 120/80   Pulse 76   Temp 97.9 F (36.6 C) (Oral)   Ht 5' 9.75" (1.772 m)   Wt 214 lb (97.1 kg)   SpO2 98%   BMI 30.93 kg/m  Wt Readings from Last 3 Encounters:  03/27/17 214 lb (97.1 kg)  11/06/16 221 lb 8 oz (100.5 kg)  10/31/16 225 lb (102.1 kg)    General: Appears his stated age, well developed, well nourished in NAD. Skin: Warm, dry and intact.  HEENT: Head: normal shape and size; Eyes: sclera white, no icterus, conjunctiva pink, PERRLA and EOMs intact; Ears: Tm's gray and  intact, normal light reflex;Throat/Mouth: Teeth present, mucosa pink and moist, no exudate, lesions or ulcerations noted.  Neck:  Neck supple, trachea midline. No masses, lumps or thyromegaly present.  Cardiovascular: Normal rate and rhythm. S1,S2 noted.  No murmur, rubs or gallops noted. No JVD or BLE edema.  Pulmonary/Chest: Normal effort and positive vesicular breath sounds. No respiratory distress. No wheezes, rales or ronchi noted.  Abdomen: Soft and nontender. Normal bowel sounds. No distention or masses noted. Liver, spleen and kidneys non palpable. Musculoskeletal: Strength 5//5 BUE/BLE.No difficulty with gait.  Neurological: Alert and oriented. Cranial nerves II-XII grossly intact. Coordination normal.  Psychiatric: Mood and affect normal. Behavior is normal. Judgment and thought content normal.    BMET    Component Value Date/Time   NA 139 02/06/2016 1637   K 4.2 02/06/2016 1637   CL 104 02/06/2016 1637   CO2 26 02/06/2016 1637   GLUCOSE 82 02/06/2016 1637   BUN 19 02/06/2016 1637   CREATININE 1.02 02/06/2016 1637   CREATININE 0.96 09/02/2014 1654   CALCIUM 9.9 02/06/2016 1637    Lipid Panel     Component Value Date/Time   CHOL 200 02/06/2016 1637   TRIG (H) 02/06/2016 1637    491.0 Triglyceride is over 400; calculations on Lipids are invalid.   HDL 35.50 (L) 02/06/2016 1637   CHOLHDL 6 02/06/2016 1637   VLDL 75 (H) 09/02/2014 1654   LDLCALC 82 09/02/2014 1654    CBC    Component Value Date/Time   WBC 9.1 02/06/2016 1637   RBC 4.79 02/06/2016 1637   HGB 14.3 02/06/2016 1637   HCT 41.4 02/06/2016 1637   PLT 245.0 02/06/2016 1637   MCV 86.4 02/06/2016 1637   MCH 29.5 09/02/2014 1654   MCHC 34.5 02/06/2016 1637   RDW 12.3 02/06/2016 1637   LYMPHSABS 2.6 09/02/2014 1654   MONOABS 0.6 09/02/2014 1654   EOSABS 0.1 09/02/2014 1654   BASOSABS 0.0 09/02/2014 1654    Hgb A1C No results found for: HGBA1C        Assessment & Plan:   Preventative Health  Maintenance:  Encouraged him to get a flu shot in the fall Tetanus UTD Encouraged him to consume a balanced diet and exercise regimen Advised him to see a dentist annually Will check CBC, CMET, Lipid profile today He declines STD screening  RTC in 1 year, sooner if needed [name], NP

## 2017-03-28 LAB — LIPID PANEL
CHOLESTEROL: 182 mg/dL (ref 0–200)
HDL: 35.1 mg/dL — AB (ref >39.00)
NonHDL: 147.19
TRIGLYCERIDES: 245 mg/dL — AB (ref 0.0–149.0)
Total CHOL/HDL Ratio: 5
VLDL: 49 mg/dL — AB (ref 0.0–40.0)

## 2017-03-28 LAB — COMPREHENSIVE METABOLIC PANEL
ALBUMIN: 4.6 g/dL (ref 3.5–5.2)
ALK PHOS: 64 U/L (ref 39–117)
ALT: 58 U/L — ABNORMAL HIGH (ref 0–53)
AST: 47 U/L — ABNORMAL HIGH (ref 0–37)
BILIRUBIN TOTAL: 1.1 mg/dL (ref 0.2–1.2)
BUN: 20 mg/dL (ref 6–23)
CALCIUM: 9.6 mg/dL (ref 8.4–10.5)
CO2: 29 mEq/L (ref 19–32)
Chloride: 102 mEq/L (ref 96–112)
Creatinine, Ser: 1.15 mg/dL (ref 0.40–1.50)
GFR: 79.8 mL/min (ref >60.00)
GLUCOSE: 89 mg/dL (ref 70–99)
POTASSIUM: 4.2 meq/L (ref 3.5–5.1)
Sodium: 137 mEq/L (ref 135–145)
TOTAL PROTEIN: 7.5 g/dL (ref 6.0–8.3)

## 2017-03-28 LAB — CBC
HEMATOCRIT: 41.2 % (ref 39.0–52.0)
HEMOGLOBIN: 14.3 g/dL (ref 13.0–17.0)
MCHC: 34.8 g/dL (ref 30.0–36.0)
MCV: 86.8 fl (ref 78.0–100.0)
PLATELETS: 243 10*3/uL (ref 150.0–400.0)
RBC: 4.74 Mil/uL (ref 4.22–5.81)
RDW: 12.7 % (ref 11.5–15.5)
WBC: 8.1 10*3/uL (ref 4.0–10.5)

## 2017-03-28 LAB — LDL CHOLESTEROL, DIRECT: Direct LDL: 112 mg/dL

## 2017-09-26 ENCOUNTER — Encounter: Payer: Self-pay | Admitting: *Deleted

## 2017-09-26 ENCOUNTER — Encounter: Payer: Self-pay | Admitting: Family Medicine

## 2017-09-26 ENCOUNTER — Ambulatory Visit (INDEPENDENT_AMBULATORY_CARE_PROVIDER_SITE_OTHER): Payer: BLUE CROSS/BLUE SHIELD | Admitting: Family Medicine

## 2017-09-26 VITALS — BP 130/84 | HR 74 | Temp 98.1°F | Resp 12 | Ht 69.75 in | Wt 205.0 lb

## 2017-09-26 DIAGNOSIS — M542 Cervicalgia: Secondary | ICD-10-CM | POA: Diagnosis not present

## 2017-09-26 MED ORDER — METHOCARBAMOL 500 MG PO TABS: 500.0000 mg | ORAL_TABLET | Freq: Three times a day (TID) | ORAL | 0 refills | Status: AC | PRN

## 2017-09-26 MED ORDER — KETOROLAC TROMETHAMINE 60 MG/2ML IM SOLN
60.0000 mg | Freq: Once | INTRAMUSCULAR | Status: AC
  Administered 2017-09-26: 60 mg via INTRAMUSCULAR

## 2017-09-26 MED ORDER — DICLOFENAC SODIUM 75 MG PO TBEC: 75.0000 mg | DELAYED_RELEASE_TABLET | Freq: Two times a day (BID) | ORAL | 0 refills | Status: AC

## 2017-09-26 MED FILL — METHOCARBAMOL 500 MG TABS: 500 | 13 days supply | Qty: 40 | Fill #0

## 2017-09-26 MED FILL — DICLOFENAC SODIUM 75 MG TAB: 75 | 10 days supply | Qty: 20 | Fill #0

## 2017-09-26 NOTE — Progress Notes (Addendum)
ACUTE VISIT   HPI:  Chief Complaint  Patient presents with  . Neck Pain    Mr.Edward Bentley is a 32 y.o. male, who is here today complaining of 6 days of neck pain, right-sided. He denies prior history.  Gradual onset and noted when he woke up 6 days ago, he denies any injury.  Pain is constant dull and intermittent sharp, no radiated, he denies any numbness or tingling of upper extremities. Pain is sharp in the morning and at night, it is dull during the day. Now pain is 6/10 but at night is usually 10/10.  + Limitation of range of motion. Pain is exacerbated by movement and alleviated by rest. He has taking Aleve for pain, he has not taking any today.  Problem has been stable.   Review of Systems  Constitutional: Negative for appetite change, chills, fatigue and fever.  HENT: Negative for mouth sores, sore throat and trouble swallowing.   Respiratory: Negative for shortness of breath and wheezing.   Cardiovascular: Negative for chest pain.  Gastrointestinal: Negative for nausea and vomiting.  Genitourinary: Negative for decreased urine volume and hematuria.  Musculoskeletal: Positive for neck pain. Negative for gait problem.  Skin: Negative for rash and wound.  Neurological: Negative for weakness, numbness and headaches.  Psychiatric/Behavioral: Positive for sleep disturbance. Negative for confusion. The patient is not nervous/anxious.       Current Outpatient Medications on File Prior to Visit  Medication Sig Dispense Refill  . amphetamine-dextroamphetamine (ADDERALL) 30 MG tablet Take 1 tablet by mouth daily. 30 tablet 0   No current facility-administered medications on file prior to visit.      Past Medical History:  Diagnosis Date  . ADD (attention deficit disorder)   . Dysrhythmia    H/O IRREGULAR HEART BEAT IN HIGH SCHOOL-NO PROBLEMS SINCE  . Family history of adverse reaction to anesthesia    PTS DAD WOKE UP DURING A SURGERY ONCE  .  GERD (gastroesophageal reflux disease)    NO MEDS   No Known Allergies  Social History   Socioeconomic History  . Marital status: Married    Spouse name: Not on file  . Number of children: Not on file  . Years of education: Not on file  . Highest education level: Not on file  Occupational History  . Not on file  Social Needs  . Financial resource strain: Not on file  . Food insecurity:    Worry: Not on file    Inability: Not on file  . Transportation needs:    Medical: Not on file    Non-medical: Not on file  Tobacco Use  . Smoking status: Never Smoker  . Smokeless tobacco: Never Used  Substance and Sexual Activity  . Alcohol use: Yes    Alcohol/week: 0.0 oz    Comment: occasional--social  . Drug use: No  . Sexual activity: Yes  Lifestyle  . Physical activity:    Days per week: Not on file    Minutes per session: Not on file  . Stress: Not on file  Relationships  . Social connections:    Talks on phone: Not on file    Gets together: Not on file    Attends religious service: Not on file    Active member of club or organization: Not on file    Attends meetings of clubs or organizations: Not on file    Relationship status: Not on file  Other Topics Concern  . Not  on file  Social History Narrative  . Not on file    Vitals:   09/26/17 1139  BP: 130/84  Pulse: 74  Resp: 12  Temp: 98.1 F (36.7 C)  SpO2: 98%   Body mass index is 29.63 kg/m.   Physical Exam  Nursing note and vitals reviewed. Constitutional: He is oriented to person, place, and time. He appears well-developed. No distress.  HENT:  Head: Normocephalic and atraumatic.  Mouth/Throat: Oropharynx is clear and moist and mucous membranes are normal.  Eyes: Pupils are equal, round, and reactive to light. Conjunctivae are normal.  Cardiovascular: Normal rate and regular rhythm.  No murmur heard. Respiratory: Effort normal and breath sounds normal. No respiratory distress.  Musculoskeletal: He  exhibits no edema.       Cervical back: He exhibits decreased range of motion and spasm. He exhibits no tenderness and no bony tenderness.       Thoracic back: He exhibits no tenderness and no bony tenderness.  No significant deformity appreciated. No tenderness upon palpation of paraspinal cervical muscles or trapezium, bilateral. No local edema or erythema appreciated, no suspicious lesions.   Lymphadenopathy:    He has no cervical adenopathy.  Neurological: He is alert and oriented to person, place, and time. He has normal strength. No cranial nerve deficit. Gait normal.  Skin: Skin is warm. No erythema.  Psychiatric: He has a normal mood and affect.  Well groomed,good eye contact.      ASSESSMENT AND PLAN:   Mr.Akshaj was seen today for neck pain.  Diagnoses and all orders for this visit:  Neck pain, acute  After verbal consent he received Toradol 60 mg IM here in the office. Given the fact he has not had any recent injury, I will obtain imaging as needed today.  He will continue with Diclofenac 75 mg twice daily as needed, instructed to start in about 6 to 8 hours. We discussed side effects of NSAIDs and muscle relaxants. Local ice and/or OTC Icy Hot or Aspercreme my also help. Local massage and ROM exercises. Instructed about warning signs. Follow-up with PCP as needed.   -     diclofenac (VOLTAREN) 75 MG EC tablet; Take 1 tablet (75 mg total) by mouth 2 (two) times daily for 10 days. -     methocarbamol (ROBAXIN) 500 MG tablet; Take 1 tablet (500 mg total) by mouth every 8 (eight) hours as needed for up to 10 days for muscle spasms. -     ketorolac (TORADOL) injection 60 mg     -Mr.Edward Bentley was advised to seek immediate medical attention if sudden worsening symptoms or to follow if they persist or if new concerns arise.       Edward Hypes G. [place], MD  [facility]. [place] office.

## 2017-09-26 NOTE — Patient Instructions (Signed)
  Mr.Demarkus R Brash I have seen you today for an acute visit.  A few things to remember from today's visit:   Neck pain, acute - Plan: diclofenac (VOLTAREN) 75 MG EC tablet, methocarbamol (ROBAXIN) 500 MG tablet   Medications prescribed today are intended for short period of time and will not be refill upon request, a follow up appointment might be necessary to discuss continuation of of treatment if appropriate.   Range of motion exercises, local massage ice,, and over-the-counter icy hot or Aspercreme might help.   In general please monitor for signs of worsening symptoms and seek immediate medical attention if any concerning.  If symptoms are not resolved in 2-3 weeks you should schedule a follow up appointment with your doctor, before if needed.  I hope you get better soon!
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
## 2017-10-17 IMAGING — CT CT CHEST W/O CM
1 of 2 series · 14 of 30 positions shown, 18 images · non-contrast
Comparison: Chest radiographs dated 10/28/2016.

CLINICAL DATA: Chest, sternal and upper back pain.  Ex-smoker.

EXAM:
CT CHEST WITHOUT CONTRAST
TECHNIQUE: Multidetector CT imaging of the chest was performed following the
standard protocol without IV contrast.

[Series 2: chest w/(date) · axial · 0.79mm/px · z∈[-159,+61]mm · 14 of 130 slices shown, 18 images]
[im 10/130  mediastinal]
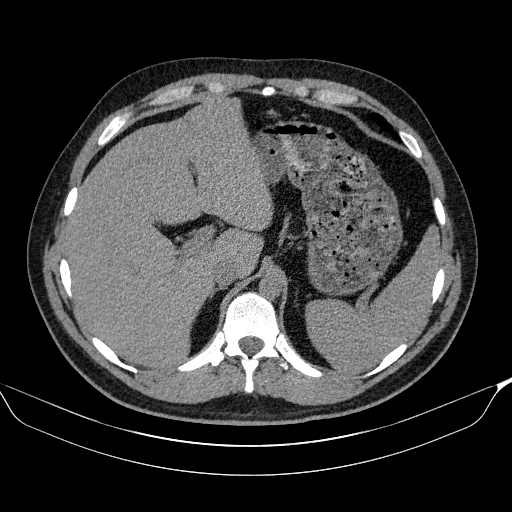
[im 10/130  lung]
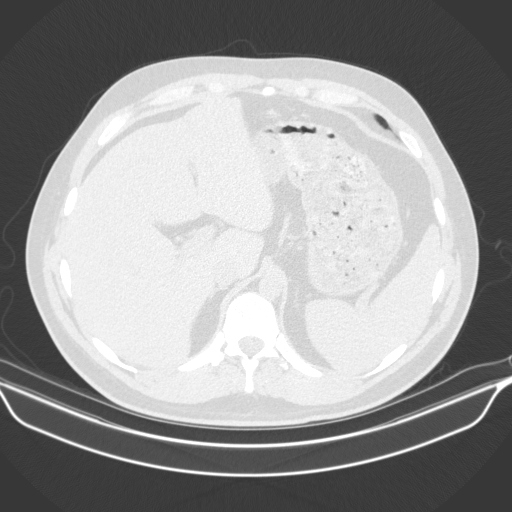
[im 19/130  lung]
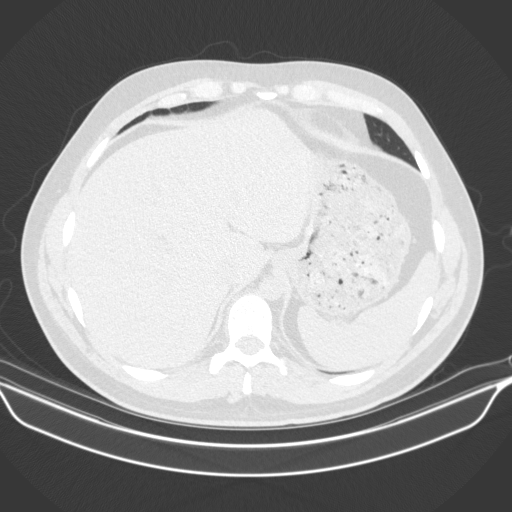
[im 28/130  lung]
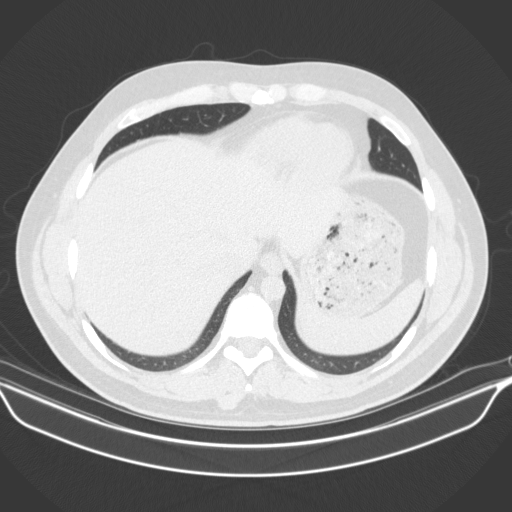
[im 37/130  lung]
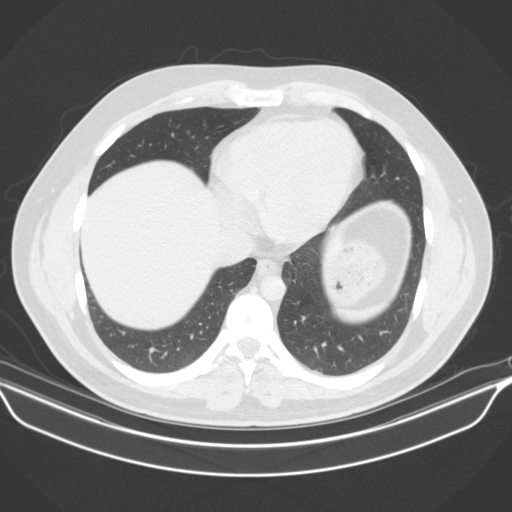
[im 47/130  mediastinal]
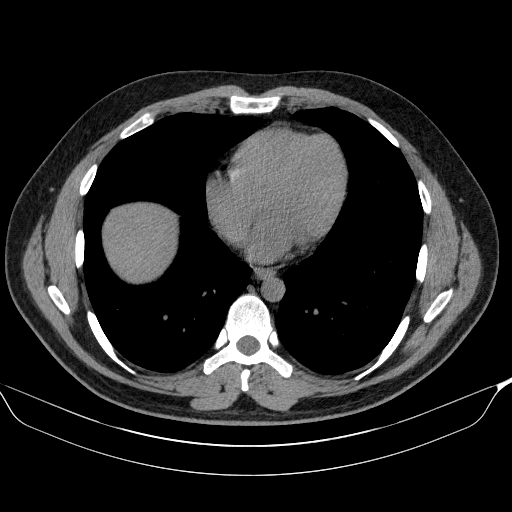
[im 47/130  lung]
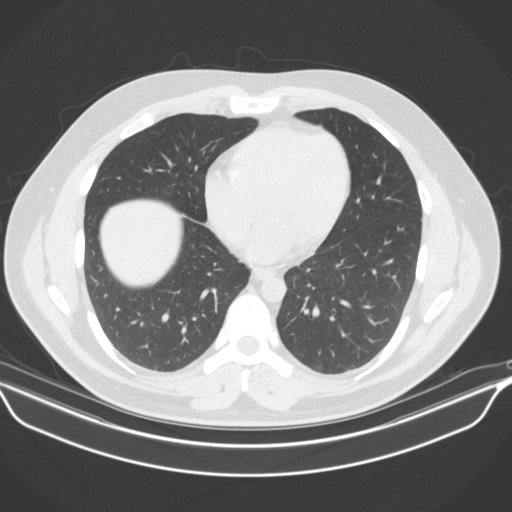
[im 56/130  lung]
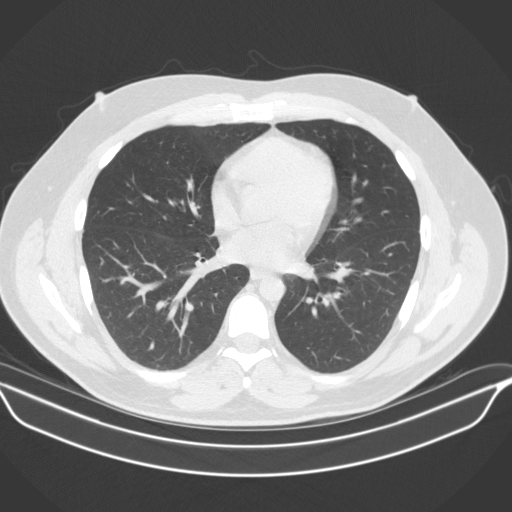
[im 61/130  lung]
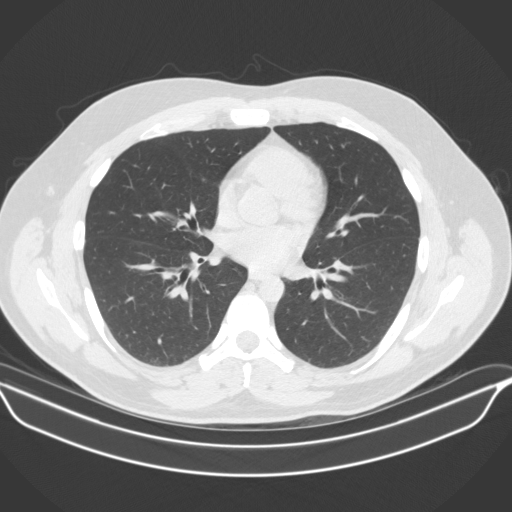
[im 65/130  lung]
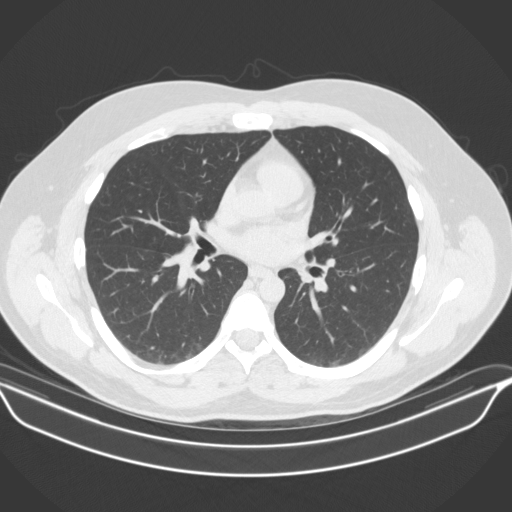
[im 74/130  mediastinal]
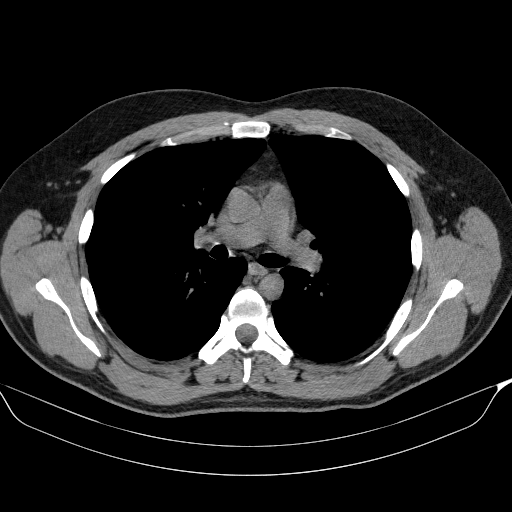
[im 74/130  lung]
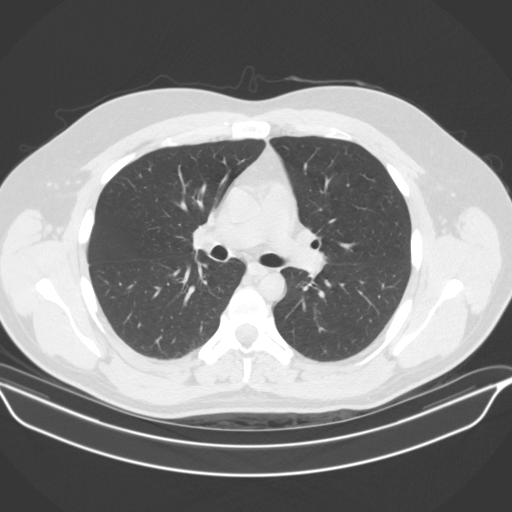
[im 83/130  lung]
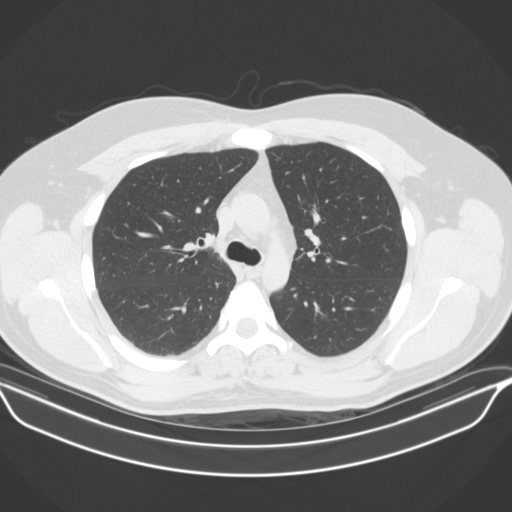
[im 93/130  lung]
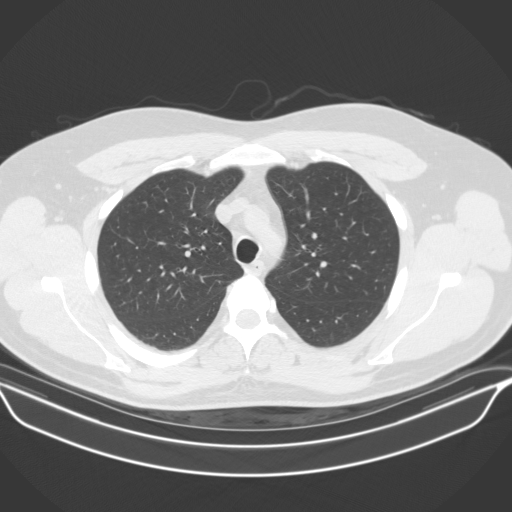
[im 102/130  lung]
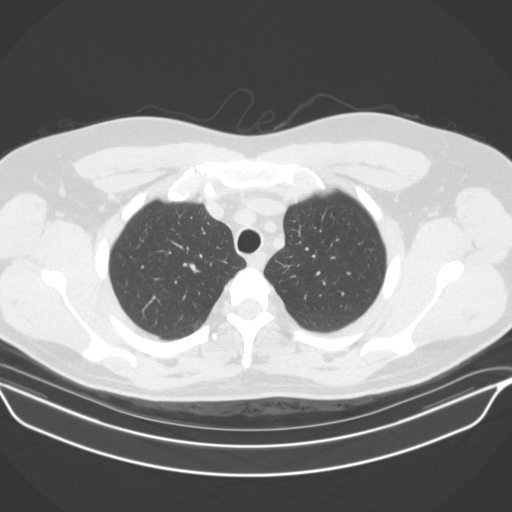
[im 111/130  mediastinal]
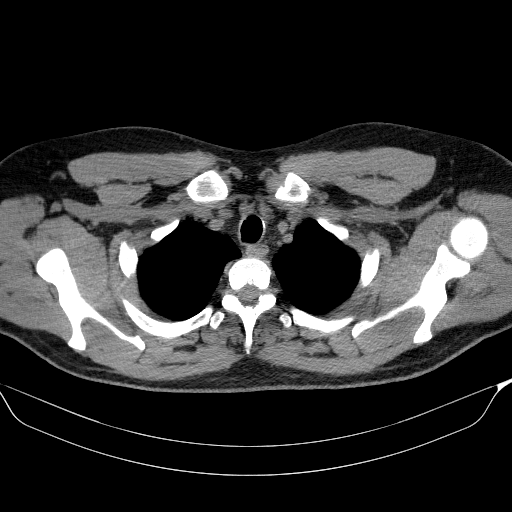
[im 111/130  lung]
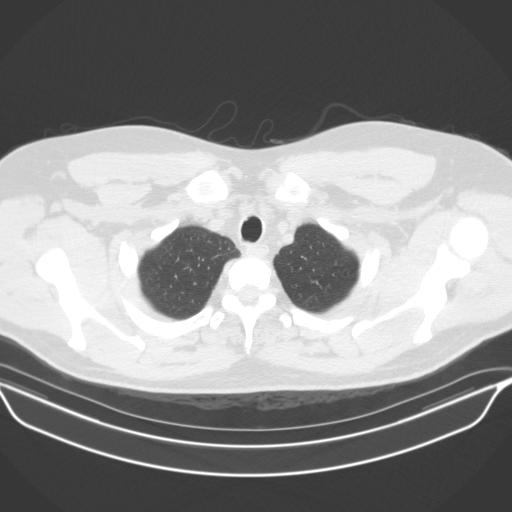
[im 120/130  lung]
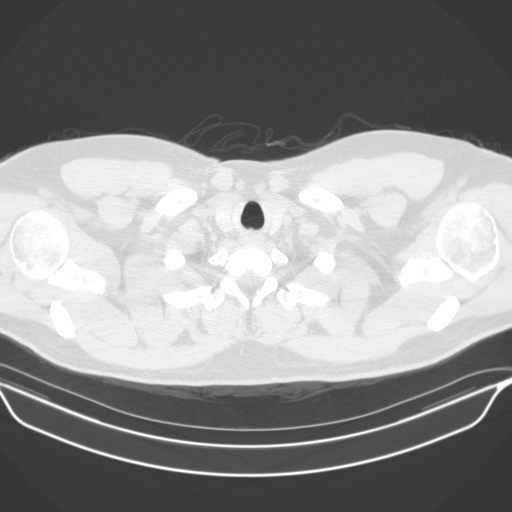

[14 of 30 positions shown; findings below may reference images not displayed]

FINDINGS: Cardiovascular: No significant vascular findings. Normal heart size.
No pericardial effusion.

Mediastinum/Nodes: No enlarged mediastinal or axillary lymph nodes.
Thyroid gland, trachea, and esophagus demonstrate no significant
findings.

Lungs/Pleura: Minimal bilateral dependent atelectasis.

Upper Abdomen: Unremarkable.

Musculoskeletal: Minimal degenerative changes. Multiple Schmorl's
nodes.
IMPRESSION: No significant abnormality.

## 2018-08-20 ENCOUNTER — Ambulatory Visit (INDEPENDENT_AMBULATORY_CARE_PROVIDER_SITE_OTHER): Payer: No Typology Code available for payment source | Admitting: Internal Medicine

## 2018-08-20 VITALS — BP 120/82 | HR 94 | Temp 97.9°F | Ht 69.75 in | Wt 201.0 lb

## 2018-08-20 DIAGNOSIS — K219 Gastro-esophageal reflux disease without esophagitis: Secondary | ICD-10-CM

## 2018-08-20 DIAGNOSIS — F900 Attention-deficit hyperactivity disorder, predominantly inattentive type: Secondary | ICD-10-CM | POA: Diagnosis not present

## 2018-08-20 DIAGNOSIS — Z0000 Encounter for general adult medical examination without abnormal findings: Secondary | ICD-10-CM

## 2018-08-21 LAB — CBC
HEMATOCRIT: 43.2 % (ref 39.0–52.0)
HEMOGLOBIN: 15.1 g/dL (ref 13.0–17.0)
MCHC: 35 g/dL (ref 30.0–36.0)
MCV: 84.7 fl (ref 78.0–100.0)
Platelets: 292 10*3/uL (ref 150.0–400.0)
RBC: 5.1 Mil/uL (ref 4.22–5.81)
RDW: 12.4 % (ref 11.5–15.5)
WBC: 6.7 10*3/uL (ref 4.0–10.5)

## 2018-08-21 LAB — COMPREHENSIVE METABOLIC PANEL
ALT: 20 U/L (ref 0–53)
AST: 18 U/L (ref 0–37)
Albumin: 4.8 g/dL (ref 3.5–5.2)
Alkaline Phosphatase: 60 U/L (ref 39–117)
BUN: 15 mg/dL (ref 6–23)
CHLORIDE: 102 meq/L (ref 96–112)
CO2: 27 mEq/L (ref 19–32)
Calcium: 9.8 mg/dL (ref 8.4–10.5)
Creatinine, Ser: 1.02 mg/dL (ref 0.40–1.50)
GFR: 85.42 mL/min (ref >60.00)
Glucose, Bld: 77 mg/dL (ref 70–99)
POTASSIUM: 4.1 meq/L (ref 3.5–5.1)
Sodium: 138 mEq/L (ref 135–145)
Total Bilirubin: 1.3 mg/dL — ABNORMAL HIGH (ref 0.2–1.2)
Total Protein: 7.8 g/dL (ref 6.0–8.3)

## 2018-08-21 LAB — LIPID PANEL
CHOLESTEROL: 204 mg/dL — AB (ref 0–200)
HDL: 36.4 mg/dL — AB (ref >39.00)
LDL Cholesterol: 148 mg/dL — ABNORMAL HIGH (ref 0–99)
NONHDL: 168.08
TRIGLYCERIDES: 100 mg/dL (ref 0.0–149.0)
Total CHOL/HDL Ratio: 6
VLDL: 20 mg/dL (ref 0.0–40.0)

## 2018-08-21 LAB — TSH: TSH: 1.57 u[IU]/mL (ref 0.35–4.50)

## 2018-08-21 LAB — HEMOGLOBIN A1C: Hgb A1c MFr Bld: 4.8 % (ref 4.6–6.5)

## 2018-08-25 ENCOUNTER — Encounter: Payer: Self-pay | Admitting: Internal Medicine

## 2018-08-25 DIAGNOSIS — K219 Gastro-esophageal reflux disease without esophagitis: Secondary | ICD-10-CM | POA: Insufficient documentation

## 2018-08-25 NOTE — Assessment & Plan Note (Signed)
No issues off meds We will monitor 

## 2018-08-25 NOTE — Assessment & Plan Note (Signed)
No issue off meds Will monitor

## 2018-08-25 NOTE — Patient Instructions (Signed)

## 2018-08-25 NOTE — Progress Notes (Signed)
Subjective:    Patient ID: Edward Bentley, male    DOB: [birth year], 33 y.o.   MRN: [id number]  HPI  Patient presents the clinic today for his annual exam.  GERD: Currently not an issue.  He is not taking any OTC medications for this.  ADD: He is doing fine off Adderall.  Flu: Never Tetanus: 02/2011 Dentist: As needed  Diet: He does eat meat.  He consumes some fruits and veggies.  He does eat fried foods.  He drinks mostly soda. Exercise: None  Subjective: Review of  Systems   Past Medical History:  Diagnosis Date  . ADD (attention deficit disorder)   . Dysrhythmia    H/O IRREGULAR HEART BEAT IN HIGH SCHOOL-NO PROBLEMS SINCE  . Family history of adverse reaction to anesthesia    PTS DAD WOKE UP DURING A SURGERY ONCE  . GERD (gastroesophageal reflux disease)    NO MEDS    No current outpatient medications on file.   No current facility-administered medications for this visit.     No Known Allergies  Family History  Problem Relation Age of Onset  . Hypertension Mother   . Hypertension Father   . Heart disease Maternal Grandfather   . Alcohol abuse Paternal Grandfather   . Cancer Neg Hx     Social History   Socioeconomic History  . Marital status: Married    Spouse name: Not on file  . Number of children: Not on file  . Years of education: Not on file  . Highest education level: Not on file  Occupational History  . Not on file  Social Needs  . Financial resource strain: Not on file  . Food insecurity:    Worry: Not on file    Inability: Not on file  . Transportation needs:    Medical: Not on file    Non-medical: Not on file  Tobacco Use  . Smoking status: Never Smoker  . Smokeless tobacco: Never Used  Substance and Sexual Activity  . Alcohol use: Yes    Alcohol/week: 0.0 standard drinks    Comment: occasional--social  . Drug use: No  . Sexual activity: Yes  Lifestyle  . Physical activity:    Days per week: Not on file    Minutes per  session: Not on file  . Stress: Not on file  Relationships  . Social connections:    Talks on phone: Not on file    Gets together: Not on file    Attends religious service: Not on file    Active member of club or organization: Not on file    Attends meetings of clubs or organizations: Not on file    Relationship status: Not on file  . Intimate partner violence:    Fear of current or ex partner: Not on file    Emotionally abused: Not on file    Physically abused: Not on file    Forced sexual activity: Not on file  Other Topics Concern  . Not on file  Social History Narrative  . Not on file     Constitutional: Denies fever, malaise, fatigue, headache or abrupt weight changes.  HEENT: Denies eye pain, eye redness, ear pain, ringing in the ears, wax buildup, runny nose, nasal congestion, bloody nose, or sore throat. Respiratory: Denies difficulty breathing, shortness of breath, cough or sputum production.   Cardiovascular: Denies chest pain, chest tightness, palpitations or swelling in the hands or feet.  Gastrointestinal: Denies abdominal pain, bloating, constipation, diarrhea or  blood in the stool.  GU: Denies urgency, frequency, pain with urination, burning sensation, blood in urine, odor or discharge. Musculoskeletal: Denies decrease in range of motion, difficulty with gait, muscle pain or joint pain and swelling.  Skin: Denies redness, rashes, lesions or ulcercations.  Neurological: Denies dizziness, difficulty with memory, difficulty with speech or problems with balance and coordination.  Psych: Denies anxiety, depression, SI/HI.  No other specific complaints in a complete review of systems (except as listed in HPI above).  Objective:   Physical Exam   BP 120/82   Pulse 94   Temp 97.9 F (36.6 C) (Oral)   Ht 5' 9.75" (1.772 m)   Wt 201 lb (91.2 kg)   SpO2 98%   BMI 29.05 kg/m  Wt Readings from Last 3 Encounters:  08/20/18 201 lb (91.2 kg)  09/26/17 205 lb (93 kg)    03/27/17 214 lb (97.1 kg)    General: Appears his stated age, well developed, well nourished in NAD. Skin: Warm, dry and intact.  HEENT: Head: normal shape and size; Eyes: sclera white, no icterus, conjunctiva pink, PERRLA and EOMs intact; Ears: Tm's gray and intact, normal light reflex; Throat/Mouth: Teeth present, mucosa pink and moist, no exudate, lesions or ulcerations noted.  Neck:  Neck supple, trachea midline. No masses, lumps or thyromegaly present.  Cardiovascular: Normal rate and rhythm. S1,S2 noted.  No murmur, rubs or gallops noted. No JVD or BLE edema. Pulmonary/Chest: Normal effort and positive vesicular breath sounds. No respiratory distress. No wheezes, rales or ronchi noted.  Abdomen: Soft and nontender. Normal bowel sounds. No distention or masses noted. Liver, spleen and kidneys non palpable. Musculoskeletal: Strength 5/5 BUE forward to BLE.Marland Kitchen No difficulty with gait.  Neurological: Alert and oriented. Cranial nerves II-XII grossly intact. Coordination normal.  Psychiatric: Mood and affect normal. Behavior is normal. Judgment and thought content normal.    BMET    Component Value Date/Time   NA 138 08/20/2018 1540   K 4.1 08/20/2018 1540   CL 102 08/20/2018 1540   CO2 27 08/20/2018 1540   GLUCOSE 77 08/20/2018 1540   BUN 15 08/20/2018 1540   CREATININE 1.02 08/20/2018 1540   CREATININE 0.96 09/02/2014 1654   CALCIUM 9.8 08/20/2018 1540    Lipid Panel     Component Value Date/Time   CHOL 204 (H) 08/20/2018 1540   TRIG 100.0 08/20/2018 1540   HDL 36.40 (L) 08/20/2018 1540   CHOLHDL 6 08/20/2018 1540   VLDL 20.0 08/20/2018 1540   LDLCALC 148 (H) 08/20/2018 1540    CBC    Component Value Date/Time   WBC 6.7 08/20/2018 1540   RBC 5.10 08/20/2018 1540   HGB 15.1 08/20/2018 1540   HCT 43.2 08/20/2018 1540   PLT 292.0 08/20/2018 1540   MCV 84.7 08/20/2018 1540   MCH 29.5 09/02/2014 1654   MCHC 35.0 08/20/2018 1540   RDW 12.4 08/20/2018 1540   LYMPHSABS  2.6 09/02/2014 1654   MONOABS 0.6 09/02/2014 1654   EOSABS 0.1 09/02/2014 1654   BASOSABS 0.0 09/02/2014 1654    Hgb A1C Lab Results  Component Value Date   HGBA1C 4.8 08/20/2018           Assessment & Plan:   Preventative Health Maintenance:  He declines flu shot today Tetanus up-to-date Encouraged him to consume a balanced diet and exercise regimen Advised him to see an eye doctor and dentist annually Will check CBC, C met, lipid, TSH and A1c today  RTC in 1  year, sooner if needed Webb Silversmith, NP

## 2019-10-03 ENCOUNTER — Ambulatory Visit: Payer: No Typology Code available for payment source

## 2019-10-07 ENCOUNTER — Telehealth: Payer: Self-pay

## 2019-10-07 NOTE — Telephone Encounter (Signed)
I spoke to pt and he states his job is wanting everyone with facial hair to be clean shaved for appearance purposes.... not for his position... pt reports he breaks out in rash on neck and face when shaves his beard... pt states he was told from HR that if he can provide a note just stating that due to his severe skin reaction, shave bumps following shaving, he needs to be able to keep his beard   Please advise

## 2019-10-07 NOTE — Telephone Encounter (Signed)
Ok for work note? 

## 2019-10-07 NOTE — Telephone Encounter (Signed)
Edward Bentley, I am not sure what he is asking for. Can we get clarification. He is also overdue for CPE.

## 2019-10-07 NOTE — Telephone Encounter (Signed)
[facility] Night - Client Nonclinical Telephone Record AccessNurse Client [facility] Night - Client Client Site [facility] - Night Contact Type Call Who Is Calling Patient / Member / Family / Caregiver Caller Name Edward Bentley Caller Phone Number [phone] Patient Name Edward Bentley Patient DOB 21-Jun-1986 Call Type Message Only Information Provided Reason for Call Request for General Office Information Initial Comment Caller states he needs a doctor note so he can a beard at work. Additional Comment Office hours provided. Disp. Time Disposition Final User 10/06/2019 6:06:13 PM General Information Provided Yes Laural Benes, Crystal Call Closed By: Velda Shell Transaction Date/Time: 10/06/2019 6:03:19 PM (ET)

## 2019-10-13 NOTE — Telephone Encounter (Signed)
Pt is aware that letter is ready, he requested that the letter be mailed... letter mailed to pt  Pt will be setting up Mychart, will redo once mychart is set up to release to [employer]

## 2019-11-15 ENCOUNTER — Ambulatory Visit (INDEPENDENT_AMBULATORY_CARE_PROVIDER_SITE_OTHER): Payer: BC Managed Care – PPO | Admitting: Internal Medicine

## 2019-11-15 ENCOUNTER — Encounter: Payer: Self-pay | Admitting: Internal Medicine

## 2019-11-15 ENCOUNTER — Other Ambulatory Visit: Payer: Self-pay

## 2019-11-15 VITALS — BP 120/80 | HR 64 | Temp 98.7°F | Ht 69.75 in | Wt 177.0 lb

## 2019-11-15 DIAGNOSIS — K219 Gastro-esophageal reflux disease without esophagitis: Secondary | ICD-10-CM

## 2019-11-15 DIAGNOSIS — F900 Attention-deficit hyperactivity disorder, predominantly inattentive type: Secondary | ICD-10-CM

## 2019-11-15 DIAGNOSIS — Z0000 Encounter for general adult medical examination without abnormal findings: Secondary | ICD-10-CM

## 2019-11-15 LAB — CBC
HCT: 39.4 % (ref 39.0–52.0)
Hemoglobin: 13.8 g/dL (ref 13.0–17.0)
MCHC: 35.2 g/dL (ref 30.0–36.0)
MCV: 85.9 fl (ref 78.0–100.0)
Platelets: 220 10*3/uL (ref 150.0–400.0)
RBC: 4.58 Mil/uL (ref 4.22–5.81)
RDW: 12.8 % (ref 11.5–15.5)
WBC: 6.7 10*3/uL (ref 4.0–10.5)

## 2019-11-15 LAB — LIPID PANEL
Cholesterol: 164 mg/dL (ref 0–200)
HDL: 47.7 mg/dL (ref >39.00)
LDL Cholesterol: 92 mg/dL (ref 0–99)
NonHDL: 116.47
Total CHOL/HDL Ratio: 3
Triglycerides: 123 mg/dL (ref 0.0–149.0)
VLDL: 24.6 mg/dL (ref 0.0–40.0)

## 2019-11-15 LAB — COMPREHENSIVE METABOLIC PANEL
ALT: 17 U/L (ref 0–53)
AST: 15 U/L (ref 0–37)
Albumin: 4.4 g/dL (ref 3.5–5.2)
Alkaline Phosphatase: 56 U/L (ref 39–117)
BUN: 13 mg/dL (ref 6–23)
CO2: 32 mEq/L (ref 19–32)
Calcium: 9.2 mg/dL (ref 8.4–10.5)
Chloride: 103 mEq/L (ref 96–112)
Creatinine, Ser: 0.97 mg/dL (ref 0.40–1.50)
GFR: 89.79 mL/min (ref >60.00)
Glucose, Bld: 86 mg/dL (ref 70–99)
Potassium: 4 mEq/L (ref 3.5–5.1)
Sodium: 140 mEq/L (ref 135–145)
Total Bilirubin: 1.2 mg/dL (ref 0.2–1.2)
Total Protein: 7.2 g/dL (ref 6.0–8.3)

## 2019-11-15 NOTE — Assessment & Plan Note (Signed)
Currently not an issue Will monitor 

## 2019-11-15 NOTE — Patient Instructions (Signed)

## 2019-11-15 NOTE — Progress Notes (Signed)
Subjective:    Patient ID: Edward Bentley, male    DOB: 1986/02/16, 34 y.o.   MRN: [id number]  HPI  Pt presents to the clinic today for his annual exam.  GERD: Currently not an issue. He is not taking any medications for this.  ADD: No issues off Adderall.  Flu: never Tetanus: 8.2012 Covid: 10/2019 Dentist: biannually  Diet: He does eat meat. He consumes fruits and veggies daily. He does eat some fried foods foods. He drinks mostly water, coffee. Exercise: None  Review of Systems      Past Medical History:  Diagnosis Date  . ADD (attention deficit disorder)   . Dysrhythmia    H/O IRREGULAR HEART BEAT IN HIGH SCHOOL-NO PROBLEMS SINCE  . Family history of adverse reaction to anesthesia    PTS DAD WOKE UP DURING A SURGERY ONCE  . GERD (gastroesophageal reflux disease)    NO MEDS    No current outpatient medications on file.   No current facility-administered medications for this visit.    No Known Allergies  Family History  Problem Relation Age of Onset  . Hypertension Mother   . Hypertension Father   . Heart disease Maternal Grandfather   . Alcohol abuse Paternal Grandfather   . Cancer Neg Hx     Social History   Socioeconomic History  . Marital status: Married    Spouse name: Not on file  . Number of children: Not on file  . Years of education: Not on file  . Highest education level: Not on file  Occupational History  . Not on file  Tobacco Use  . Smoking status: Never Smoker  . Smokeless tobacco: Never Used  Substance and Sexual Activity  . Alcohol use: Yes    Alcohol/week: 0.0 standard drinks    Comment: occasional--social  . Drug use: No  . Sexual activity: Yes  Other Topics Concern  . Not on file  Social History Narrative  . Not on file   Social Determinants of Health   Financial Resource Strain:   . Difficulty of Paying Living Expenses:   Food Insecurity:   . Worried About Charity fundraiser in the Last Year:   . Academic librarian in the Last Year:   Transportation Needs:   . Film/video editor (Medical):   Marland Kitchen Lack of Transportation (Non-Medical):   Physical Activity:   . Days of Exercise per Week:   . Minutes of Exercise per Session:   Stress:   . Feeling of Stress :   Social Connections:   . Frequency of Communication with Friends and Family:   . Frequency of Social Gatherings with Friends and Family:   . Attends Religious Services:   . Active Member of Clubs or Organizations:   . Attends Archivist Meetings:   Marland Kitchen Marital Status:   Intimate Partner Violence:   . Fear of Current or Ex-Partner:   . Emotionally Abused:   Marland Kitchen Physically Abused:   . Sexually Abused:      Constitutional: Denies fever, malaise, fatigue, headache or abrupt weight changes.  HEENT: Denies eye pain, eye redness, ear pain, ringing in the ears, wax buildup, runny nose, nasal congestion, bloody nose, or sore throat. Respiratory: Denies difficulty breathing, shortness of breath, cough or sputum production.   Cardiovascular: Denies chest pain, chest tightness, palpitations or swelling in the hands or feet.  Gastrointestinal: Denies abdominal pain, bloating, constipation, diarrhea or blood in the stool.  GU: Denies urgency,  frequency, pain with urination, burning sensation, blood in urine, odor or discharge. Musculoskeletal: Denies decrease in range of motion, difficulty with gait, muscle pain or joint pain and swelling.  Skin: Denies redness, rashes, lesions or ulcercations.  Neurological: Denies dizziness, difficulty with memory, difficulty with speech or problems with balance and coordination.  Psych: Denies anxiety, depression, SI/HI.  No other specific complaints in a complete review of systems (except as listed in HPI above).  Objective:   Physical Exam    BP 120/80 (BP Location: Left Arm, Patient Position: Sitting, Cuff Size: Normal)   Pulse 64   Temp 98.7 F (37.1 C) (Other (Comment))   Ht 5' 9.75" (1.772  m)   Wt 177 lb (80.3 kg)   SpO2 98%   BMI 25.58 kg/m  Wt Readings from Last 3 Encounters:  11/15/19 177 lb (80.3 kg)  08/20/18 201 lb (91.2 kg)  09/26/17 205 lb (93 kg)    General: Appears his stated age, well developed, well nourished in NAD. Skin: Warm, dry and intact. No rashes noted. HEENT: Head: normal shape and size; Eyes: sclera white, no icterus, conjunctiva pink, PERRLA and EOMs intact;  Neck:  Neck supple, trachea midline. No masses, lumps or thyromegaly present.  Cardiovascular: Normal rate and rhythm. S1,S2 noted.  No murmur, rubs or gallops noted. No JVD or BLE edema.  Pulmonary/Chest: Normal effort and positive vesicular breath sounds. No respiratory distress. No wheezes, rales or ronchi noted.  Abdomen: Soft and nontender. Normal bowel sounds. No distention or masses noted. Liver, spleen and kidneys non palpable. Musculoskeletal: Strength 5/5 BUE/BLE. No difficulty with gait.  Neurological: Alert and oriented. Cranial nerves II-XII grossly intact. Coordination normal.  Psychiatric: Mood and affect normal. Behavior is normal. Judgment and thought content normal.     BMET    Component Value Date/Time   NA 138 08/20/2018 1540   K 4.1 08/20/2018 1540   CL 102 08/20/2018 1540   CO2 27 08/20/2018 1540   GLUCOSE 77 08/20/2018 1540   BUN 15 08/20/2018 1540   CREATININE 1.02 08/20/2018 1540   CREATININE 0.96 09/02/2014 1654   CALCIUM 9.8 08/20/2018 1540    Lipid Panel     Component Value Date/Time   CHOL 204 (H) 08/20/2018 1540   TRIG 100.0 08/20/2018 1540   HDL 36.40 (L) 08/20/2018 1540   CHOLHDL 6 08/20/2018 1540   VLDL 20.0 08/20/2018 1540   LDLCALC 148 (H) 08/20/2018 1540    CBC    Component Value Date/Time   WBC 6.7 08/20/2018 1540   RBC 5.10 08/20/2018 1540   HGB 15.1 08/20/2018 1540   HCT 43.2 08/20/2018 1540   PLT 292.0 08/20/2018 1540   MCV 84.7 08/20/2018 1540   MCH 29.5 09/02/2014 1654   MCHC 35.0 08/20/2018 1540   RDW 12.4 08/20/2018 1540     LYMPHSABS 2.6 09/02/2014 1654   MONOABS 0.6 09/02/2014 1654   EOSABS 0.1 09/02/2014 1654   BASOSABS 0.0 09/02/2014 1654    Hgb A1C Lab Results  Component Value Date   HGBA1C 4.8 08/20/2018          Assessment & Plan:   Preventative Health Maintenance:  Encouraged him to get a flu shot in the fall Tetanus due next year Encouraged him to consume a balanced diet and exercise regimen Advised him to see a dentist annually Will check CBC, CMET, Lipid profile today  RTC in 1 year, sooner if needed [name], NP This visit occurred during the SARS-CoV-2 public health emergency.  Safety protocols were in place, including screening questions prior to the visit, additional usage of staff PPE, and extensive cleaning of exam room while observing appropriate contact time as indicated for disinfecting solutions.

## 2020-06-06 ENCOUNTER — Telehealth: Payer: Self-pay | Admitting: Internal Medicine

## 2020-06-06 NOTE — Telephone Encounter (Signed)
Pt called in wanted to know about getting a referral for a hernia he has .

## 2020-06-07 NOTE — Telephone Encounter (Signed)
He needs to make an appt here. We need to order ultrasound

## 2020-06-09 NOTE — Telephone Encounter (Signed)
appt scheduled for 06/16/20 at 4:15

## 2020-06-16 ENCOUNTER — Encounter: Payer: Self-pay | Admitting: Internal Medicine

## 2020-06-16 ENCOUNTER — Other Ambulatory Visit: Payer: Self-pay

## 2020-06-16 ENCOUNTER — Ambulatory Visit: Payer: BC Managed Care – PPO | Admitting: Internal Medicine

## 2020-06-16 VITALS — BP 120/76 | HR 64 | Temp 97.6°F | Wt 180.0 lb

## 2020-06-16 DIAGNOSIS — K4090 Unilateral inguinal hernia, without obstruction or gangrene, not specified as recurrent: Secondary | ICD-10-CM

## 2020-06-16 NOTE — Progress Notes (Signed)
Subjective:    Patient ID: Edward Bentley, male    DOB: Apr 06, 1986, 34 y.o.   MRN: [id number]  HPI  Pt presents to the clinic today with concern for left inguinal hernia. He has noticed a bulge in this area that can be tender with palpation or heavy lifting. He denies urinary or bowel issues. He reports history of right inguinal hernia s/p repair in the past.  Review of Systems      Past Medical History:  Diagnosis Date  . ADD (attention deficit disorder)   . Dysrhythmia    H/O IRREGULAR HEART BEAT IN HIGH SCHOOL-NO PROBLEMS SINCE  . Family history of adverse reaction to anesthesia    PTS DAD WOKE UP DURING A SURGERY ONCE  . GERD (gastroesophageal reflux disease)    NO MEDS    No current outpatient medications on file.   No current facility-administered medications for this visit.    No Known Allergies  Family History  Problem Relation Age of Onset  . Hypertension Mother   . Hypertension Father   . Heart disease Maternal Grandfather   . Alcohol abuse Paternal Grandfather   . Cancer Neg Hx     Social History   Socioeconomic History  . Marital status: Married    Spouse name: Not on file  . Number of children: Not on file  . Years of education: Not on file  . Highest education level: Not on file  Occupational History  . Not on file  Tobacco Use  . Smoking status: Never Smoker  . Smokeless tobacco: Never Used  Substance and Sexual Activity  . Alcohol use: Yes    Alcohol/week: 0.0 standard drinks    Comment: occasional--social  . Drug use: No  . Sexual activity: Yes  Other Topics Concern  . Not on file  Social History Narrative  . Not on file   Social Determinants of Health   Financial Resource Strain: Not on file  Food Insecurity: Not on file  Transportation Needs: Not on file  Physical Activity: Not on file  Stress: Not on file  Social Connections: Not on file  Intimate Partner Violence: Not on file     Constitutional: Denies fever,  malaise, fatigue, headache or abrupt weight changes.  Respiratory: Denies difficulty breathing, shortness of breath, cough or sputum production.   Cardiovascular: Denies chest pain, chest tightness, palpitations or swelling in the hands or feet.  Gastrointestinal: Denies abdominal pain, bloating, constipation, diarrhea or blood in the stool.  GU: Denies urgency, frequency, pain with urination, burning sensation, blood in urine, odor or discharge. Skin: Pt reports mass in the left inguinal area. Denies redness, rashes, lesions or ulcercations.   No other specific complaints in a complete review of systems (except as listed in HPI above).  Objective:   Physical Exam   There were no vitals taken for this visit. Wt Readings from Last 3 Encounters:  11/15/19 177 lb (80.3 kg)  08/20/18 201 lb (91.2 kg)  09/26/17 205 lb (93 kg)   General: Appears his stated age, well developed, well nourished in NAD. Skin: Warm, dry and intact.  Cardiovascular: Normal rate and rhythm. S1,S2 noted.   Pulmonary/Chest: Normal effort and positive vesicular breath sounds. No respiratory distress. No wheezes, rales or ronchi noted.  Abdomen: Soft and nontender. Normal bowel sounds. No distention or masses noted.  GU: Inguinal hernia noted on the left. Neurological: Alert and oriented.   BMET    Component Value Date/Time   NA 140  11/15/2019 1438   K 4.0 11/15/2019 1438   CL 103 11/15/2019 1438   CO2 32 11/15/2019 1438   GLUCOSE 86 11/15/2019 1438   BUN 13 11/15/2019 1438   CREATININE 0.97 11/15/2019 1438   CREATININE 0.96 09/02/2014 1654   CALCIUM 9.2 11/15/2019 1438    Lipid Panel     Component Value Date/Time   CHOL 164 11/15/2019 1438   TRIG 123.0 11/15/2019 1438   HDL 47.70 11/15/2019 1438   CHOLHDL 3 11/15/2019 1438   VLDL 24.6 11/15/2019 1438   LDLCALC 92 11/15/2019 1438    CBC    Component Value Date/Time   WBC 6.7 11/15/2019 1438   RBC 4.58 11/15/2019 1438   HGB 13.8 11/15/2019 1438    HCT 39.4 11/15/2019 1438   PLT 220.0 11/15/2019 1438   MCV 85.9 11/15/2019 1438   MCH 29.5 09/02/2014 1654   MCHC 35.2 11/15/2019 1438   RDW 12.8 11/15/2019 1438   LYMPHSABS 2.6 09/02/2014 1654   MONOABS 0.6 09/02/2014 1654   EOSABS 0.1 09/02/2014 1654   BASOSABS 0.0 09/02/2014 1654    Hgb A1C Lab Results  Component Value Date   HGBA1C 4.8 08/20/2018           Assessment & Plan:   Inguinal Hernia, Left:  Referral to gen surgery for hernia repair  Return precautions discussed [name], NP This visit occurred during the SARS-CoV-2 public health emergency.  Safety protocols were in place, including screening questions prior to the visit, additional usage of staff PPE, and extensive cleaning of exam room while observing appropriate contact time as indicated for disinfecting solutions.

## 2020-06-16 NOTE — Patient Instructions (Signed)
° °Inguinal Hernia, Adult °An inguinal hernia is when fat or your intestines push through a weak spot in a muscle where your leg meets your lower belly (groin). This causes a rounded lump (bulge). This kind of hernia could also be: °· In your scrotum, if you are male. °· In folds of skin around your vagina, if you are male. °There are three types of inguinal hernias. These include: °· Hernias that can be pushed back into the belly (are reducible). This type rarely causes pain. °· Hernias that cannot be pushed back into the belly (are incarcerated). °· Hernias that cannot be pushed back into the belly and lose their blood supply (are strangulated). This type needs emergency surgery. °If you do not have symptoms, you may not need treatment. If you have symptoms or a large hernia, you may need surgery. °Follow these instructions at home: °Lifestyle °· Do these things if told by your doctor so you do not have trouble pooping (constipation): °? Drink enough fluid to keep your pee (urine) pale yellow. °? Eat foods that have a lot of fiber. These include fresh fruits and vegetables, whole grains, and beans. °? Limit foods that are high in fat and processed sugars. These include foods that are fried or sweet. °? Take medicine for trouble pooping. °· Avoid lifting heavy objects. °· Avoid standing for long amounts of time. °· Do not use any products that contain nicotine or tobacco. These include cigarettes and e-cigarettes. If you need help quitting, ask your doctor. °· Stay at a healthy weight. °General instructions °· You may try to push your hernia in by very gently pressing on it when you are lying down. Do not try to force the bulge back in if it will not push in easily. °· Watch your hernia for any changes in shape, size, or color. Tell your doctor if you see any changes. °· Take over-the-counter and prescription medicines only as told by your doctor. °· Keep all follow-up visits as told by your doctor. This is  important. °Contact a doctor if: °· You have a fever. °· You have new symptoms. °· Your symptoms get worse. °Get help right away if: °· The area where your leg meets your lower belly has: °? Pain that gets worse suddenly. °? A bulge that gets bigger suddenly, and it does not get smaller after that. °? A bulge that turns red or purple. °? A bulge that is painful when you touch it. °· You are a man, and your scrotum: °? Suddenly feels painful. °? Suddenly changes in size. °· You cannot push the hernia in by very gently pressing on it when you are lying down. Do not try to force the bulge back in if it will not push in easily. °· You feel sick to your stomach (nauseous), and that feeling does not go away. °· You throw up (vomit), and that keeps happening. °· You have a fast heartbeat. °· You cannot poop (have a bowel movement) or pass gas. °These symptoms may be an emergency. Do not wait to see if the symptoms will go away. Get medical help right away. Call your local emergency services (911 in the [place] °Summary °· An inguinal hernia is when fat or your intestines push through a weak spot in a muscle where your leg meets your lower belly (groin). This causes a rounded lump (bulge). °· If you do not have symptoms, you may not need treatment. If you have symptoms or a large hernia,   you may need surgery. °· Avoid lifting heavy objects. Also avoid standing for long amounts of time. °· Do not try to force the bulge back in if it will not push in easily. °This information is not intended to replace advice given to you by your health care provider. Make sure you discuss any questions you have with your health care provider. °Document Revised: 07/26/2017 Document Reviewed: 03/26/2017 °Elsevier Patient Education © 2020 [employer] ° °

## 2021-07-31 ENCOUNTER — Encounter: Payer: BC Managed Care – PPO | Admitting: Nurse Practitioner

## 2022-05-20 ENCOUNTER — Emergency Department: Payer: BC Managed Care – PPO

## 2022-05-20 ENCOUNTER — Encounter: Payer: Self-pay | Admitting: *Deleted

## 2022-05-20 ENCOUNTER — Other Ambulatory Visit: Payer: Self-pay

## 2022-05-20 DIAGNOSIS — J189 Pneumonia, unspecified organism: Secondary | ICD-10-CM | POA: Diagnosis not present

## 2022-05-20 DIAGNOSIS — R0602 Shortness of breath: Secondary | ICD-10-CM | POA: Diagnosis present

## 2022-05-20 LAB — CBC
HCT: 38.5 % — ABNORMAL LOW (ref 39.0–52.0)
Hemoglobin: 13.4 g/dL (ref 13.0–17.0)
MCH: 28.9 pg (ref 26.0–34.0)
MCHC: 34.8 g/dL (ref 30.0–36.0)
MCV: 83.2 fL (ref 80.0–100.0)
Platelets: 201 10*3/uL (ref 150–400)
RBC: 4.63 MIL/uL (ref 4.22–5.81)
RDW: 11.6 % (ref 11.5–15.5)
WBC: 8.3 10*3/uL (ref 4.0–10.5)
nRBC: 0 % (ref 0.0–0.2)

## 2022-05-20 NOTE — ED Triage Notes (Signed)
Pt reports sob today.  Recent pneumonia dx. Pt reports a dry.   Non smoker. Pt reports pain on right side of chest.  Pt alert  speech clear.

## 2022-05-21 ENCOUNTER — Encounter: Payer: Self-pay | Admitting: Radiology

## 2022-05-21 ENCOUNTER — Emergency Department
Admission: EM | Admit: 2022-05-21 | Discharge: 2022-05-21 | Disposition: A | Discharge status: Home / Self Care | Payer: BC Managed Care – PPO | Attending: Emergency Medicine | Admitting: Emergency Medicine

## 2022-05-21 ENCOUNTER — Emergency Department: Payer: BC Managed Care – PPO

## 2022-05-21 DIAGNOSIS — J189 Pneumonia, unspecified organism: Secondary | ICD-10-CM

## 2022-05-21 LAB — BASIC METABOLIC PANEL
Anion gap: 6 (ref 5–15)
BUN: 18 mg/dL (ref 6–20)
CO2: 26 mmol/L (ref 22–32)
Calcium: 9.2 mg/dL (ref 8.9–10.3)
Chloride: 104 mmol/L (ref 98–111)
Creatinine, Ser: 1.05 mg/dL (ref 0.61–1.24)
GFR, Estimated: >60 mL/min (ref >60)
Glucose, Bld: 94 mg/dL (ref 70–99)
Potassium: 3.5 mmol/L (ref 3.5–5.1)
Sodium: 136 mmol/L (ref 135–145)

## 2022-05-21 LAB — TROPONIN I (HIGH SENSITIVITY)
Troponin I (High Sensitivity): 3 ng/L (ref <18)
Troponin I (High Sensitivity): <2 ng/L (ref <18)

## 2022-05-21 LAB — BRAIN NATRIURETIC PEPTIDE: B Natriuretic Peptide: 8 pg/mL (ref 0.0–100.0)

## 2022-05-21 MED ORDER — IOHEXOL 350 MG/ML SOLN
75.0000 mL | Freq: Once | INTRAVENOUS | Status: AC | PRN
  Administered 2022-05-21: 75 mL via INTRAVENOUS

## 2022-05-21 MED ORDER — SODIUM CHLORIDE 0.9 % IV BOLUS
1000.0000 mL | Freq: Once | INTRAVENOUS | Status: AC
  Administered 2022-05-21: 1000 mL via INTRAVENOUS

## 2022-05-21 MED ORDER — LEVOFLOXACIN 750 MG PO TABS: 750.0000 mg | ORAL_TABLET | Freq: Every day | ORAL | 0 refills | Status: AC

## 2022-05-21 MED ORDER — LEVOFLOXACIN 500 MG PO TABS
750.0000 mg | ORAL_TABLET | Freq: Once | ORAL | Status: AC
  Administered 2022-05-21: 750 mg via ORAL
  Filled 2022-05-21: qty 2

## 2022-05-21 MED ORDER — HYDROCODONE BIT-HOMATROP MBR 5-1.5 MG/5ML PO SOLN: 5.0000 mL | Freq: Four times a day (QID) | ORAL | 0 refills | Status: AC | PRN

## 2022-05-21 MED ORDER — DEXAMETHASONE SODIUM PHOSPHATE 10 MG/ML IJ SOLN
10.0000 mg | Freq: Once | INTRAMUSCULAR | Status: AC
  Administered 2022-05-21: 10 mg via INTRAVENOUS
  Filled 2022-05-21: qty 1

## 2022-05-21 MED ORDER — KETOROLAC TROMETHAMINE 30 MG/ML IJ SOLN
15.0000 mg | Freq: Once | INTRAMUSCULAR | Status: AC
  Administered 2022-05-21: 15 mg via INTRAVENOUS
  Filled 2022-05-21: qty 1

## 2022-05-21 NOTE — ED Provider Notes (Signed)
[facility] Provider Note    Event Date/Time   First MD Initiated Contact with Patient 05/21/22 0248     (approximate)   History   Shortness of Breath   HPI  Edward Bentley is a 36 y.o. male  here with SOB. Pt reports that he was diagnosed with PNA over 1 week ago. He was put on doxy and amox but was only takingt he amox BID instead of TID. He had felt slightly better but over the week. However, over the past 2 days or so he has had return and worsening of his cough with associated pleuritic right-sided chest pain. His significant other, a nurse, told him to come to the ED for evaluation. No recent immobilization, no family h/o PEs. No other complaints.       Physical Exam   Triage Vital Signs: ED Triage Vitals  Enc Vitals Group     BP 05/20/22 2328 (!) 123/95     Pulse Rate 05/20/22 2328 (!) 112     Resp 05/20/22 2328 20     Temp 05/20/22 2328 98 F (36.7 C)     Temp Source 05/20/22 2328 Oral     SpO2 05/20/22 2328 97 %     Weight 05/20/22 2329 195 lb (88.5 kg)     Height 05/20/22 2329 5\' 10"  (1.778 m)     Head Circumference --      Peak Flow --      Pain Score 05/20/22 2329 5     Pain Loc --      Pain Edu? --      Excl. in GC? --     Most recent vital signs: Vitals:   05/21/22 0300 05/21/22 0330  BP: 115/84 110/72  Pulse: 90 80  Resp: 12 17  Temp:  98.3 F (36.8 C)  SpO2: 100% 100%     General: Awake, no distress.  CV:  Good peripheral perfusion. RRR. Resp:  Normal effort. Lungs overall with normal aeration but bibasilar rales appreciated. Abd:  No distention. No tenderness. Other:  No leg swelling or asymmetry.   ED Results / Procedures / Treatments   Labs (all labs ordered are listed, but only abnormal results are displayed) Labs Reviewed  CBC - Abnormal; Notable for the following components:      Result Value   HCT 38.5 (*)    All other components within normal limits  CULTURE, BLOOD (SINGLE)  BASIC METABOLIC  PANEL  BRAIN NATRIURETIC PEPTIDE  TROPONIN I (HIGH SENSITIVITY)  TROPONIN I (HIGH SENSITIVITY)     EKG Normal sinus rhythm, VR 100. PR 166, QRS 144, QTc 492. No acute St elevations or depressions. LBBB, no Sgarbossa.   RADIOLOGY CT Angio: mild atelectasis with possible underlying bronchoPNA CXR: Clear   I also independently reviewed and agree with radiologist interpretations.   PROCEDURES:  Critical Care performed: No  .1-3 Lead EKG Interpretation  Performed by: 05/23/22, MD Authorized by: Shaune Pollack, MD     Interpretation: normal     ECG rate:  80-100   ECG rate assessment: normal     Rhythm: sinus rhythm     Ectopy: none     Conduction: normal   Comments:     Indication: SOB     MEDICATIONS ORDERED IN ED: Medications  sodium chloride 0.9 % bolus 1,000 mL (0 mLs Intravenous Stopped 05/21/22 0444)  ketorolac (TORADOL) 30 MG/ML injection 15 mg (15 mg Intravenous Given 05/21/22 0345)  iohexol (OMNIPAQUE)  350 MG/ML injection 75 mL (75 mLs Intravenous Contrast Given 05/21/22 0352)  levofloxacin (LEVAQUIN) tablet 750 mg (750 mg Oral Given 05/21/22 0457)  dexamethasone (DECADRON) injection 10 mg (10 mg Intravenous Given 05/21/22 0458)     IMPRESSION / MDM / ASSESSMENT AND PLAN / ED COURSE  I reviewed the triage vital signs and the nursing notes.                               This patient presents to the ED for concern of SOB, this involves an extensive number of treatment options, and is a complaint that carries with it a high risk of complications and morbidity.  The differential diagnosis includes Ongoing CAP, PE, costochondritis or other MSK chest pain, PTX, atelectasis, ACS, basilar PNA   Co morbidities that complicate the patient evaluation  None   Additional history obtained:  Additional history obtained from None External records from outside source obtained and reviewed including Prior CXR reviewed and notes from diagnosis, which showed  RML PNA   Lab Tests:  I Ordered, and personally interpreted labs.  The pertinent results include:      Imaging Studies ordered:  I ordered imaging studies including CT Angio, CXR  I independently visualized and interpreted imaging which showed: CXR: Clear CT ANgio: Negative for PE, mild atelectasis with broncho PNA I agree with the radiologist interpretation   Cardiac Monitoring: / EKG:  The patient was maintained on a cardiac monitor.  I personally viewed and interpreted the cardiac monitored which showed an underlying rhythm of: NSR   Problem List / ED Course / Critical interventions / Medication management  Chest pain/SOB: Suspect this is MSK in setting of recent PNA, frequent coughing, also possible ongoing bronchoPNA in setting of partial treatment. No signs of sepsis. CBC without major leukocytosis. Lytes/renal function normal.Satting well on RA. CT Angio obtained, reviewed, shows no acute PE, mild bronchoPNA noted. EKG nonischemic and trop negative, doubt ACS. Pain is not concerning for dissection. Will place pt on levaquin as he had difficulty completing doxy/amox. He is aware of risks of fluoroquinolones and I counselled him to avoid strenuous exercise. D/c with encouraged hydration and supportive care. I ordered medication including Levaquin  for PNA  Reevaluation of the patient after these medicines showed that the patient stayed the same I have reviewed the patients home medicines and have made adjustments as needed   Social Determinants of Health:  Non-contributory   Test / Admission - Considered:  No indication for admission at this time, satting well on RA with no signs of sepsis or true treatment failure.   FINAL CLINICAL IMPRESSION(S) / ED DIAGNOSES   Final diagnoses:  Community acquired pneumonia, unspecified laterality     Rx / DC Orders   ED Discharge Orders          Ordered    levofloxacin (LEVAQUIN) 750 MG tablet  Daily        05/21/22 0448     HYDROcodone bit-homatropine (HYCODAN) 5-1.5 MG/5ML syrup  Every 6 hours PRN        05/21/22 0448             Note:  This document was prepared using Dragon voice recognition software and may include unintentional dictation errors.   Shaune Pollack, MD 05/21/22 [phone]

## 2022-05-23 ENCOUNTER — Telehealth: Payer: Self-pay

## 2022-05-23 NOTE — Telephone Encounter (Signed)
Transition Care Management Follow-up Telephone Call Date of discharge and from where: [facility] 05/21/2022 How have you been since you were released from the hospital? Still coughing Any questions or concerns? [place]  Items Reviewed: Did the pt receive and understand the discharge instructions provided? Yes  Medications obtained and verified? Yes  Other? No  Any new allergies since your discharge? No  Dietary orders reviewed? Yes Do you have support at home? Yes   Home Care and Equipment/Supplies: Were home health services ordered? not applicable If so, what is the name of the agency? N/a  Has the agency set up a time to come to the patient's home? not applicable Were any new equipment or medical supplies ordered?  No What is the name of the medical supply agency? N/a Were you able to get the supplies/equipment? not applicable Do you have any questions related to the use of the equipment or supplies? No  Functional Questionnaire: (I = Independent and D = Dependent) ADLs: I  Bathing/Dressing- I  Meal Prep- I  Eating- I  Maintaining continence- I  Transferring/Ambulation- I  Managing Meds- I  Follow up appointments reviewed:  PCP Hospital f/u appt confirmed? No  Patient hung up Specialist Hospital f/u appt confirmed? No   Are transportation arrangements needed? No  If their condition worsens, is the pt aware to call PCP or go to the Emergency Dept.? Yes Was the patient provided with contact information for the PCP's office or ED? Yes Was to pt encouraged to call back with questions or concerns? Yes Karena Addison, LPN [facility] Nurse Health Advisor Direct Dial [phone]

## 2022-05-24 NOTE — Telephone Encounter (Signed)
Will discuss at upcoming appt.

## 2022-05-26 LAB — CULTURE, BLOOD (SINGLE)
Culture: NO GROWTH
Special Requests: ADEQUATE
